# Patient Record
Sex: Female | Born: 1970 | Race: Black or African American | Hispanic: No | Marital: Single | Smoking: Never smoker
Health system: Southern US, Community
[De-identification: ages and names within clinical notes are randomized; demographics above are authoritative.]

## PROBLEM LIST (undated history)

## (undated) DIAGNOSIS — A64 Unspecified sexually transmitted disease: Secondary | ICD-10-CM

## (undated) DIAGNOSIS — G43009 Migraine without aura, not intractable, without status migrainosus: Secondary | ICD-10-CM

## (undated) DIAGNOSIS — H269 Unspecified cataract: Secondary | ICD-10-CM

## (undated) DIAGNOSIS — Z789 Other specified health status: Secondary | ICD-10-CM

## (undated) DIAGNOSIS — D259 Leiomyoma of uterus, unspecified: Secondary | ICD-10-CM

## (undated) DIAGNOSIS — M7580 Other shoulder lesions, unspecified shoulder: Secondary | ICD-10-CM

## (undated) HISTORY — DX: Migraine without aura, not intractable, without status migrainosus: G43.009

## (undated) HISTORY — PX: EYE SURGERY: SHX253

## (undated) HISTORY — DX: Unspecified sexually transmitted disease: A64

## (undated) HISTORY — DX: Leiomyoma of uterus, unspecified: D25.9

## (undated) HISTORY — PX: OTHER SURGICAL HISTORY: SHX169

## (undated) HISTORY — PX: FOOT SURGERY: SHX648

## (undated) HISTORY — DX: Other shoulder lesions, unspecified shoulder: M75.80

## (undated) HISTORY — DX: Unspecified cataract: H26.9

---

## 2006-01-30 HISTORY — PX: OTHER SURGICAL HISTORY: SHX169

## 2008-07-30 ENCOUNTER — Ambulatory Visit (HOSPITAL_COMMUNITY): Admission: RE | Admit: 2008-07-30 | Discharge: 2008-07-30 | Payer: Self-pay | Admitting: Obstetrics & Gynecology

## 2009-08-31 ENCOUNTER — Ambulatory Visit (HOSPITAL_COMMUNITY): Admission: RE | Admit: 2009-08-31 | Discharge: 2009-08-31 | Payer: Self-pay | Admitting: Obstetrics & Gynecology

## 2009-09-10 ENCOUNTER — Emergency Department (HOSPITAL_COMMUNITY): Admission: EM | Admit: 2009-09-10 | Discharge: 2009-09-10 | Payer: Self-pay | Admitting: Emergency Medicine

## 2010-04-15 LAB — RAPID STREP SCREEN (MED CTR MEBANE ONLY): Streptococcus, Group A Screen (Direct): NEGATIVE

## 2010-08-23 ENCOUNTER — Other Ambulatory Visit (HOSPITAL_COMMUNITY): Payer: Self-pay | Admitting: Occupational Therapy

## 2010-08-23 ENCOUNTER — Other Ambulatory Visit: Payer: Self-pay | Admitting: Nurse Practitioner

## 2010-08-23 DIAGNOSIS — Z1231 Encounter for screening mammogram for malignant neoplasm of breast: Secondary | ICD-10-CM

## 2010-09-02 ENCOUNTER — Ambulatory Visit (HOSPITAL_COMMUNITY)
Admission: RE | Admit: 2010-09-02 | Discharge: 2010-09-02 | Disposition: A | Discharge status: Home / Self Care | Payer: BC Managed Care – PPO | Source: Ambulatory Visit | Attending: Nurse Practitioner | Admitting: Nurse Practitioner

## 2010-09-02 DIAGNOSIS — Z1231 Encounter for screening mammogram for malignant neoplasm of breast: Secondary | ICD-10-CM

## 2011-01-31 HISTORY — PX: FOOT SURGERY: SHX648

## 2011-09-21 ENCOUNTER — Other Ambulatory Visit: Payer: Self-pay | Admitting: Nurse Practitioner

## 2011-09-21 DIAGNOSIS — Z1231 Encounter for screening mammogram for malignant neoplasm of breast: Secondary | ICD-10-CM

## 2011-10-17 ENCOUNTER — Ambulatory Visit (HOSPITAL_COMMUNITY)
Admission: RE | Admit: 2011-10-17 | Discharge: 2011-10-17 | Disposition: A | Discharge status: Home / Self Care | Payer: BC Managed Care – PPO | Source: Ambulatory Visit | Attending: Nurse Practitioner | Admitting: Nurse Practitioner

## 2011-10-17 DIAGNOSIS — Z1231 Encounter for screening mammogram for malignant neoplasm of breast: Secondary | ICD-10-CM | POA: Insufficient documentation

## 2012-01-07 ENCOUNTER — Encounter: Payer: Self-pay | Admitting: Gynecology

## 2012-01-07 NOTE — H&P (Signed)
[name] is an 41 y.o. female with known fibroid uterus who had menorrhagia controled with progestin only pills until recently.  Pt began having break thru bleeding for 1 month after missing 2 pills.  Pt had gonorrhea and chlamydia cultures done-both negative, and EMB and repeat u/s.  Ultrasound showed an increase in the size of fibroids largest 2 are 4x4cm and 3.5x3cm, other fibroids also noted.  Pt does not desire conception.  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    Past Medical History  Diagnosis Date  . Fibroid uterus   . Migraine headache without aura     No past surgical history on file.  No family history on file.  Social History:  does not have a smoking history on file. She does not have any smokeless tobacco history on file. Her alcohol and drug histories not on file.  Allergies: Allergies not on file  No prescriptions prior to admission    Review of Systems  Constitutional: Positive for malaise/fatigue. Negative for fever, chills, weight loss and diaphoresis.  HENT: Negative for hearing loss, ear pain, nosebleeds, congestion, sore throat, tinnitus and ear discharge.   Eyes: Negative.  Negative for blurred vision, double vision and photophobia.  Respiratory: Negative.  Negative for stridor.   Cardiovascular: Negative.   Gastrointestinal: Negative.   Genitourinary: Negative.   Musculoskeletal: Negative.   Skin: Negative for itching and rash.  Neurological: Positive for headaches. Negative for weakness.   Endo/Heme/Allergies: Negative.   Psychiatric/Behavioral: Negative.     There were no vitals taken for this visit. Physical Exam  Constitutional: Vital signs are normal. She appears well-developed and well-nourished.  HENT:  Head: Normocephalic and atraumatic.  Eyes: Conjunctivae normal and EOM are normal. Pupils are equal, round, and reactive to light.  Neck: Normal range of motion. Neck supple.  Cardiovascular: Normal rate, regular rhythm, normal heart sounds and intact distal pulses.   Respiratory: Effort normal and breath sounds normal.  GI: Soft. Normal appearance and bowel sounds are normal. There is no tenderness. Hernia confirmed negative in the right inguinal area and confirmed negative in the left inguinal area.  Genitourinary: Rectum normal. No breast swelling or tenderness. Pelvic exam was performed with patient supine. There is no rash, tenderness or lesion on the right labia. There is no rash, tenderness, lesion or injury on the left labia. Uterus is enlarged. Uterus is not fixed and not tender. Cervix exhibits no motion tenderness, no discharge and no friability. Right adnexum displays no mass, no tenderness and no fullness. Left adnexum displays no mass, no tenderness and no fullness. No tenderness around the vagina. No vaginal discharge found.  Musculoskeletal: Normal range of motion.  Lymphadenopathy:    She has no cervical adenopathy.    She has no axillary adenopathy.       Right: No inguinal adenopathy present.       Left: No inguinal adenopathy present.  Skin: Skin is warm, dry and intact.    No results found for this or any previous visit (from the past 24 hour(s)).  No results found.  Assessment/Plan: Symtomatic fibroid uterus with breakthough bleeding on progestin only oc's after being  stable for years, with increase in size of fibroids, now for definitive treatment for robotic hysterectomy with bilateral salpingectomies  Rilyn Upshaw H 01/07/2012, 8:06  PM

## 2012-01-08 ENCOUNTER — Encounter (HOSPITAL_COMMUNITY): Payer: Self-pay

## 2012-01-08 ENCOUNTER — Other Ambulatory Visit (HOSPITAL_COMMUNITY): Payer: BC Managed Care – PPO

## 2012-01-08 ENCOUNTER — Inpatient Hospital Stay (HOSPITAL_COMMUNITY): Admission: RE | Admit: 2012-01-08 | Discharge: 2012-01-08 | Payer: BC Managed Care – PPO | Source: Ambulatory Visit

## 2012-01-08 ENCOUNTER — Encounter (HOSPITAL_COMMUNITY): Payer: Self-pay | Admitting: Pharmacist

## 2012-01-08 HISTORY — DX: Other specified health status: Z78.9

## 2012-01-09 ENCOUNTER — Encounter (HOSPITAL_COMMUNITY): Payer: Self-pay | Admitting: Anesthesiology

## 2012-01-09 ENCOUNTER — Ambulatory Visit (HOSPITAL_COMMUNITY): Payer: BC Managed Care – PPO | Admitting: Anesthesiology

## 2012-01-09 ENCOUNTER — Encounter (HOSPITAL_COMMUNITY): Payer: Self-pay | Admitting: *Deleted

## 2012-01-09 ENCOUNTER — Encounter (HOSPITAL_COMMUNITY): Payer: Self-pay

## 2012-01-09 ENCOUNTER — Ambulatory Visit (HOSPITAL_COMMUNITY)
Admission: RE | Admit: 2012-01-09 | Discharge: 2012-01-10 | Disposition: A | Discharge status: Home / Self Care | Payer: BC Managed Care – PPO | Source: Ambulatory Visit | Attending: Gynecology | Admitting: Gynecology

## 2012-01-09 ENCOUNTER — Encounter (HOSPITAL_COMMUNITY)
Admission: RE | Disposition: A | Discharge status: Home / Self Care | Payer: Self-pay | Source: Ambulatory Visit | Attending: Gynecology

## 2012-01-09 DIAGNOSIS — N8000 Endometriosis of the uterus, unspecified: Secondary | ICD-10-CM | POA: Insufficient documentation

## 2012-01-09 DIAGNOSIS — D250 Submucous leiomyoma of uterus: Secondary | ICD-10-CM | POA: Insufficient documentation

## 2012-01-09 DIAGNOSIS — N920 Excessive and frequent menstruation with regular cycle: Secondary | ICD-10-CM | POA: Insufficient documentation

## 2012-01-09 DIAGNOSIS — D251 Intramural leiomyoma of uterus: Secondary | ICD-10-CM | POA: Insufficient documentation

## 2012-01-09 DIAGNOSIS — D252 Subserosal leiomyoma of uterus: Secondary | ICD-10-CM | POA: Insufficient documentation

## 2012-01-09 HISTORY — PX: CYSTOSCOPY: SHX5120

## 2012-01-09 HISTORY — PX: BILATERAL SALPINGECTOMY: SHX5743

## 2012-01-09 HISTORY — PX: ROBOTIC ASSISTED TOTAL HYSTERECTOMY: SHX6085

## 2012-01-09 LAB — SURGICAL PCR SCREEN
MRSA, PCR: NEGATIVE
Staphylococcus aureus: NEGATIVE

## 2012-01-09 LAB — URINALYSIS, ROUTINE W REFLEX MICROSCOPIC
Glucose, UA: NEGATIVE mg/dL
Ketones, ur: 15 mg/dL — AB
Leukocytes, UA: NEGATIVE
Nitrite: NEGATIVE
Specific Gravity, Urine: 1.015 (ref 1.005–1.030)
pH: 7 (ref 5.0–8.0)

## 2012-01-09 LAB — ABO/RH: ABO/RH(D): O POS

## 2012-01-09 LAB — PREGNANCY, URINE: Preg Test, Ur: NEGATIVE

## 2012-01-09 LAB — URINE MICROSCOPIC-ADD ON

## 2012-01-09 LAB — TYPE AND SCREEN: ABO/RH(D): O POS

## 2012-01-09 LAB — CBC
Hemoglobin: 13.7 g/dL (ref 12.0–15.0)
Platelets: 244 10*3/uL (ref 150–400)
RBC: 4.56 MIL/uL (ref 3.87–5.11)
WBC: 5.7 10*3/uL (ref 4.0–10.5)

## 2012-01-09 SURGERY — ROBOTIC ASSISTED TOTAL HYSTERECTOMY
Anesthesia: General | Site: Abdomen | Wound class: Clean Contaminated

## 2012-01-09 MED ORDER — LIDOCAINE HCL (CARDIAC) 20 MG/ML IV SOLN
INTRAVENOUS | Status: AC
  Filled 2012-01-09: qty 5

## 2012-01-09 MED ORDER — ACETAMINOPHEN 10 MG/ML IV SOLN
INTRAVENOUS | Status: AC
  Administered 2012-01-09: 1000 mg via INTRAVENOUS
  Filled 2012-01-09: qty 100

## 2012-01-09 MED ORDER — HYDROCODONE-ACETAMINOPHEN 5-325 MG PO TABS
1.0000 | ORAL_TABLET | ORAL | Status: DC | PRN
  Administered 2012-01-09 – 2012-01-10 (×3): 2 via ORAL
  Filled 2012-01-09 (×3): qty 2

## 2012-01-09 MED ORDER — GLYCOPYRROLATE 0.2 MG/ML IJ SOLN: INTRAMUSCULAR | Status: DC | PRN

## 2012-01-09 MED ORDER — FENTANYL CITRATE 0.05 MG/ML IJ SOLN
INTRAMUSCULAR | Status: AC
  Filled 2012-01-09: qty 5

## 2012-01-09 MED ORDER — ONDANSETRON HCL 4 MG/2ML IJ SOLN
INTRAMUSCULAR | Status: AC
  Filled 2012-01-09: qty 2

## 2012-01-09 MED ORDER — SODIUM CHLORIDE 0.9 % IJ SOLN
INTRAMUSCULAR | Status: DC | PRN
  Administered 2012-01-09: 60 mL

## 2012-01-09 MED ORDER — LIDOCAINE HCL (CARDIAC) 20 MG/ML IV SOLN
INTRAVENOUS | Status: DC | PRN
  Administered 2012-01-09: 50 mg via INTRAVENOUS

## 2012-01-09 MED ORDER — ROPIVACAINE HCL 5 MG/ML IJ SOLN
INTRAMUSCULAR | Status: AC
  Filled 2012-01-09: qty 60

## 2012-01-09 MED ORDER — DEXAMETHASONE SODIUM PHOSPHATE 10 MG/ML IJ SOLN
INTRAMUSCULAR | Status: DC | PRN
  Administered 2012-01-09: 10 mg via INTRAVENOUS

## 2012-01-09 MED ORDER — NEOSTIGMINE METHYLSULFATE 1 MG/ML IJ SOLN
INTRAMUSCULAR | Status: DC | PRN
  Administered 2012-01-09: 3 mg via INTRAVENOUS

## 2012-01-09 MED ORDER — STERILE WATER FOR IRRIGATION IR SOLN
Status: DC | PRN
  Administered 2012-01-09: 2000 mL via INTRAVESICAL

## 2012-01-09 MED ORDER — GLYCOPYRROLATE 0.2 MG/ML IJ SOLN
INTRAMUSCULAR | Status: DC | PRN
  Administered 2012-01-09: 0.6 mg via INTRAVENOUS

## 2012-01-09 MED ORDER — ROPIVACAINE HCL 5 MG/ML IJ SOLN
INTRAMUSCULAR | Status: DC | PRN
  Administered 2012-01-09: 60 mL

## 2012-01-09 MED ORDER — ARTIFICIAL TEARS OP OINT
TOPICAL_OINTMENT | OPHTHALMIC | Status: DC | PRN
  Administered 2012-01-09: 1 via OPHTHALMIC

## 2012-01-09 MED ORDER — FENTANYL CITRATE 0.05 MG/ML IJ SOLN: 25.0000 ug | INTRAMUSCULAR | Status: DC | PRN

## 2012-01-09 MED ORDER — LACTATED RINGERS IV SOLN
INTRAVENOUS | Status: DC
  Administered 2012-01-09: 125 mL/h via INTRAVENOUS
  Administered 2012-01-09: 14:00:00 via INTRAVENOUS
  Administered 2012-01-09: 125 mL/h via INTRAVENOUS

## 2012-01-09 MED ORDER — ARTIFICIAL TEARS OP OINT
TOPICAL_OINTMENT | OPHTHALMIC | Status: AC
  Filled 2012-01-09: qty 3.5

## 2012-01-09 MED ORDER — CEFAZOLIN SODIUM-DEXTROSE 2-3 GM-% IV SOLR
INTRAVENOUS | Status: AC
  Filled 2012-01-09: qty 50

## 2012-01-09 MED ORDER — ROCURONIUM BROMIDE 100 MG/10ML IV SOLN
INTRAVENOUS | Status: DC | PRN
  Administered 2012-01-09: 10 mg via INTRAVENOUS
  Administered 2012-01-09: 20 mg via INTRAVENOUS
  Administered 2012-01-09: 60 mg via INTRAVENOUS

## 2012-01-09 MED ORDER — DEXAMETHASONE SODIUM PHOSPHATE 10 MG/ML IJ SOLN
INTRAMUSCULAR | Status: AC
  Filled 2012-01-09: qty 1

## 2012-01-09 MED ORDER — PROPOFOL 10 MG/ML IV EMUL
INTRAVENOUS | Status: AC
  Filled 2012-01-09: qty 20

## 2012-01-09 MED ORDER — GLYCOPYRROLATE 0.2 MG/ML IJ SOLN
INTRAMUSCULAR | Status: AC
  Filled 2012-01-09: qty 3

## 2012-01-09 MED ORDER — CEFAZOLIN SODIUM-DEXTROSE 2-3 GM-% IV SOLR
2.0000 g | INTRAVENOUS | Status: AC
  Administered 2012-01-09: 2 g via INTRAVENOUS

## 2012-01-09 MED ORDER — MUPIROCIN 2 % EX OINT
TOPICAL_OINTMENT | Freq: Once | CUTANEOUS | Status: AC
  Administered 2012-01-09: 1 via NASAL

## 2012-01-09 MED ORDER — MUPIROCIN 2 % EX OINT
TOPICAL_OINTMENT | CUTANEOUS | Status: AC
  Administered 2012-01-09: 1 via NASAL
  Filled 2012-01-09: qty 22

## 2012-01-09 MED ORDER — LACTATED RINGERS IR SOLN
Status: DC | PRN
  Administered 2012-01-09: 3000 mL

## 2012-01-09 MED ORDER — KETOROLAC TROMETHAMINE 30 MG/ML IJ SOLN
INTRAMUSCULAR | Status: AC
  Filled 2012-01-09: qty 1

## 2012-01-09 MED ORDER — ONDANSETRON HCL 4 MG/2ML IJ SOLN
INTRAMUSCULAR | Status: DC | PRN
  Administered 2012-01-09: 4 mg via INTRAVENOUS

## 2012-01-09 MED ORDER — DEXTROSE IN LACTATED RINGERS 5 % IV SOLN: INTRAVENOUS | Status: DC

## 2012-01-09 MED ORDER — MIDAZOLAM HCL 5 MG/5ML IJ SOLN
INTRAMUSCULAR | Status: DC | PRN
  Administered 2012-01-09: 2 mg via INTRAVENOUS

## 2012-01-09 MED ORDER — HYDROMORPHONE HCL PF 1 MG/ML IJ SOLN
INTRAMUSCULAR | Status: AC
  Filled 2012-01-09: qty 1

## 2012-01-09 MED ORDER — HYDROMORPHONE HCL PF 1 MG/ML IJ SOLN
0.2000 mg | INTRAMUSCULAR | Status: DC | PRN
  Administered 2012-01-09 (×2): 0.5 mg via INTRAVENOUS

## 2012-01-09 MED ORDER — INDIGOTINDISULFONATE SODIUM 8 MG/ML IJ SOLN
INTRAMUSCULAR | Status: AC
  Filled 2012-01-09: qty 5

## 2012-01-09 MED ORDER — PROPOFOL 10 MG/ML IV BOLUS
INTRAVENOUS | Status: DC | PRN
  Administered 2012-01-09: 200 mg via INTRAVENOUS

## 2012-01-09 MED ORDER — ROCURONIUM BROMIDE 50 MG/5ML IV SOLN
INTRAVENOUS | Status: AC
  Filled 2012-01-09: qty 1

## 2012-01-09 MED ORDER — LACTATED RINGERS IV SOLN
INTRAVENOUS | Status: DC
  Administered 2012-01-09 – 2012-01-10 (×2): via INTRAVENOUS

## 2012-01-09 MED ORDER — MIDAZOLAM HCL 2 MG/2ML IJ SOLN
INTRAMUSCULAR | Status: AC
  Filled 2012-01-09: qty 2

## 2012-01-09 MED ORDER — INDIGOTINDISULFONATE SODIUM 8 MG/ML IJ SOLN
INTRAMUSCULAR | Status: DC | PRN
  Administered 2012-01-09: 5 mL via INTRAVENOUS

## 2012-01-09 MED ORDER — FENTANYL CITRATE 0.05 MG/ML IJ SOLN
INTRAMUSCULAR | Status: DC | PRN
  Administered 2012-01-09: 150 ug via INTRAVENOUS
  Administered 2012-01-09 (×2): 50 ug via INTRAVENOUS

## 2012-01-09 MED ORDER — KETOROLAC TROMETHAMINE 30 MG/ML IJ SOLN
15.0000 mg | Freq: Once | INTRAMUSCULAR | Status: AC | PRN
  Administered 2012-01-09: 30 mg via INTRAVENOUS

## 2012-01-09 SURGICAL SUPPLY — 76 items
BAG URINE DRAINAGE (UROLOGICAL SUPPLIES) ×4 IMPLANT
BARRIER ADHS 3X4 INTERCEED (GAUZE/BANDAGES/DRESSINGS) ×4 IMPLANT
BENZOIN TINCTURE PRP APPL 2/3 (GAUZE/BANDAGES/DRESSINGS) IMPLANT
CABLE HIGH FREQUENCY MONO STRZ (ELECTRODE) ×4 IMPLANT
CHLORAPREP W/TINT 26ML (MISCELLANEOUS) ×4 IMPLANT
CONT PATH 16OZ SNAP LID 3702 (MISCELLANEOUS) ×4 IMPLANT
COVER MAYO STAND STRL (DRAPES) ×4 IMPLANT
COVER TABLE BACK 60X90 (DRAPES) ×8 IMPLANT
COVER TIP SHEARS 8 DVNC (MISCELLANEOUS) ×3 IMPLANT
COVER TIP SHEARS 8MM DA VINCI (MISCELLANEOUS) ×1
DECANTER SPIKE VIAL GLASS SM (MISCELLANEOUS) ×4 IMPLANT
DERMABOND ADVANCED (GAUZE/BANDAGES/DRESSINGS) ×1
DERMABOND ADVANCED .7 DNX12 (GAUZE/BANDAGES/DRESSINGS) ×3 IMPLANT
DRAPE HUG U DISPOSABLE (DRAPE) ×4 IMPLANT
DRAPE LG THREE QUARTER DISP (DRAPES) ×8 IMPLANT
DRAPE WARM FLUID 44X44 (DRAPE) ×4 IMPLANT
ELECT REM PT RETURN 9FT ADLT (ELECTROSURGICAL) ×4
ELECTRODE REM PT RTRN 9FT ADLT (ELECTROSURGICAL) ×3 IMPLANT
EVACUATOR SMOKE 8.L (FILTER) ×4 IMPLANT
GAUZE VASELINE 3X9 (GAUZE/BANDAGES/DRESSINGS) IMPLANT
GLOVE BIO SURGEON STRL SZ 6.5 (GLOVE) ×16 IMPLANT
GLOVE BIOGEL M 6.5 STRL (GLOVE) ×20 IMPLANT
GLOVE BIOGEL PI IND STRL 6.5 (GLOVE) ×9 IMPLANT
GLOVE BIOGEL PI IND STRL 7.0 (GLOVE) ×27 IMPLANT
GLOVE BIOGEL PI INDICATOR 6.5 (GLOVE) ×3
GLOVE BIOGEL PI INDICATOR 7.0 (GLOVE) ×9
GLOVE SURG SS PI 7.0 STRL IVOR (GLOVE) ×20 IMPLANT
GOWN STRL REIN XL XLG (GOWN DISPOSABLE) ×32 IMPLANT
GYRUS RUMI II 2.5CM BLUE (DISPOSABLE)
GYRUS RUMI II 3.5CM BLUE (DISPOSABLE)
GYRUS RUMI II 4.0CM BLUE (DISPOSABLE)
KIT ACCESSORY DA VINCI DISP (KITS) ×1
KIT ACCESSORY DVNC DISP (KITS) ×3 IMPLANT
LEGGING LITHOTOMY PAIR STRL (DRAPES) ×4 IMPLANT
NEEDLE INSUFFLATION 14GA 120MM (NEEDLE) ×4 IMPLANT
OCCLUDER COLPOPNEUMO (BALLOONS) ×4 IMPLANT
PACK LAVH (CUSTOM PROCEDURE TRAY) ×4 IMPLANT
PAD PREP 24X48 CUFFED NSTRL (MISCELLANEOUS) ×8 IMPLANT
PLUG CATH AND CAP STER (CATHETERS) ×4 IMPLANT
PROTECTOR NERVE ULNAR (MISCELLANEOUS) ×12 IMPLANT
RUMI II 3.0CM BLUE KOH-EFFICIE (DISPOSABLE) IMPLANT
RUMI II GYRUS 2.5CM BLUE (DISPOSABLE) IMPLANT
RUMI II GYRUS 3.5CM BLUE (DISPOSABLE) IMPLANT
RUMI II GYRUS 4.0CM BLUE (DISPOSABLE) IMPLANT
SET CYSTO W/LG BORE CLAMP LF (SET/KITS/TRAYS/PACK) ×4 IMPLANT
SET IRRIG TUBING LAPAROSCOPIC (IRRIGATION / IRRIGATOR) ×4 IMPLANT
SOLUTION ELECTROLUBE (MISCELLANEOUS) ×4 IMPLANT
STRIP CLOSURE SKIN 1/4X4 (GAUZE/BANDAGES/DRESSINGS) IMPLANT
SUT VIC AB 0 CT1 27 (SUTURE) ×3
SUT VIC AB 0 CT1 27XBRD ANBCTR (SUTURE) ×6 IMPLANT
SUT VIC AB 0 CT1 27XBRD ANTBC (SUTURE) ×3 IMPLANT
SUT VICRYL 0 UR6 27IN ABS (SUTURE) ×4 IMPLANT
SUT VICRYL RAPIDE 4/0 PS 2 (SUTURE) ×8 IMPLANT
SUT VLOC 180 0 6IN GS21 (SUTURE) ×4 IMPLANT
SUT VLOC 180 0 9IN  GS21 (SUTURE) ×1
SUT VLOC 180 0 9IN GS21 (SUTURE) ×3 IMPLANT
SYR 30ML LL (SYRINGE) ×4 IMPLANT
SYR 50ML LL SCALE MARK (SYRINGE) ×4 IMPLANT
SYRINGE 10CC LL (SYRINGE) ×4 IMPLANT
SYSTEM CONVERTIBLE TROCAR (TROCAR) ×4 IMPLANT
TIP RUMI ORANGE 6.7MMX12CM (TIP) IMPLANT
TIP UTERINE 5.1X6CM LAV DISP (MISCELLANEOUS) IMPLANT
TIP UTERINE 6.7X10CM GRN DISP (MISCELLANEOUS) IMPLANT
TIP UTERINE 6.7X6CM WHT DISP (MISCELLANEOUS) IMPLANT
TIP UTERINE 6.7X8CM BLUE DISP (MISCELLANEOUS) ×4 IMPLANT
TOWEL OR 17X24 6PK STRL BLUE (TOWEL DISPOSABLE) ×12 IMPLANT
TRAY FOLEY BAG SILVER LF 14FR (CATHETERS) ×4 IMPLANT
TROCAR 12M 150ML BLUNT (TROCAR) ×4 IMPLANT
TROCAR DISP BLADELESS 8 DVNC (TROCAR) ×3 IMPLANT
TROCAR DISP BLADELESS 8MM (TROCAR) ×1
TROCAR XCEL 12X100 BLDLESS (ENDOMECHANICALS) ×4 IMPLANT
TROCAR XCEL NON-BLD 5MMX100MML (ENDOMECHANICALS) ×4 IMPLANT
TROCAR Z THRD FIOS 12X150 (TROCAR) ×4 IMPLANT
TUBING FILTER THERMOFLATOR (ELECTROSURGICAL) ×4 IMPLANT
WARMER LAPAROSCOPE (MISCELLANEOUS) ×4 IMPLANT
WATER STERILE IRR 1000ML POUR (IV SOLUTION) ×12 IMPLANT

## 2012-01-09 NOTE — Preoperative (Signed)
Beta Blockers   Reason not to administer Beta Blockers:Not Applicable 

## 2012-01-09 NOTE — Anesthesia Postprocedure Evaluation (Signed)
Anesthesia Post Note  Patient: [name]  Procedure(s) Performed: Procedure(s) (LRB): ROBOTIC ASSISTED TOTAL HYSTERECTOMY (N/A) BILATERAL SALPINGECTOMY (Bilateral) CYSTOSCOPY (N/A)  Anesthesia type: GA  Patient location: PACU  Post pain: Pain level controlled  Post assessment: Post-op Vital signs reviewed  Last Vitals:  Filed Vitals:   01/09/12 1600  BP: 123/64  Pulse: 68  Temp:   Resp: 16    Post vital signs: Reviewed  Level of consciousness: sedated  Complications: No apparent anesthesia complications

## 2012-01-09 NOTE — Interval H&P Note (Signed)
History and Physical Interval Note:  01/09/2012 11:46 AM  [name]  has presented today for surgery, with the diagnosis of Fibroids  The various methods of treatment have been discussed with the patient and family. After consideration of risks, benefits and other options for treatment, the patient has consented to  Procedure(s) (LRB) with comments: ROBOTIC ASSISTED TOTAL HYSTERECTOMY (N/A) as a surgical intervention .  The patient's history has been reviewed, patient examined, no change in status, stable for surgery.  I have reviewed the patient's chart and labs.  Questions were answered to the patient's satisfaction.     [name] H  No change-tl

## 2012-01-09 NOTE — Brief Op Note (Signed)
01/09/2012  3:26 PM  PATIENT:  [name]  41 y.o. female  PRE-OPERATIVE DIAGNOSIS:  Fibroids  POST-OPERATIVE DIAGNOSIS:  Fibroids  PROCEDURE:  Procedure(s) (LRB) with comments: ROBOTIC ASSISTED TOTAL HYSTERECTOMY (N/A) BILATERAL SALPINGECTOMY (Bilateral) CYSTOSCOPY (N/A)  SURGEON:  Surgeon(s) and Role:    * Bennye Alm, MD - Primary    * Alison Murray, MD - Assisting  PHYSICIAN ASSISTANT:   ASSISTANTS: none   ANESTHESIA:   general   EBL:  Total I/O In: 1000 [I.V.:1000] Out: 300 [Urine:200; Blood:100]  BLOOD ADMINISTERED:none  DRAINS: none   LOCAL MEDICATIONS USED:  OTHER 10cc ropivicaine  SPECIMEN:  Source of Specimen:  Uterus, cervix, bilateral tubes  DISPOSITION OF SPECIMEN:  PATHOLOGY  COUNTS:  YES  TOURNIQUET:  * No tourniquets in log *  DICTATION: .Other Dictation: Dictation Number [id number]  PLAN OF CARE: outpatient in bed  PATIENT DISPOSITION:  Short Stay   Delay start of Pharmacological VTE agent (>24hrs) due to surgical blood loss or risk of bleeding: no

## 2012-01-09 NOTE — Anesthesia Preprocedure Evaluation (Signed)
Anesthesia Evaluation  Patient identified by MRN, date of birth, ID band Patient awake    Reviewed: Allergy & Precautions, H&P , NPO status , Patient's Chart, lab work & pertinent test results, reviewed documented beta blocker date and time   History of Anesthesia Complications Negative for: history of anesthetic complications  Airway Mallampati: I TM Distance: >3 FB Neck ROM: full    Dental  (+) Teeth Intact   Pulmonary neg pulmonary ROS,  breath sounds clear to auscultation  Pulmonary exam normal       Cardiovascular Exercise Tolerance: Good negative cardio ROS  Rhythm:regular Rate:Normal     Neuro/Psych  Headaches, negative psych ROS   GI/Hepatic negative GI ROS, Neg liver ROS,   Endo/Other  negative endocrine ROS  Renal/GU negative Renal ROS  Female GU complaint     Musculoskeletal   Abdominal   Peds  Hematology negative hematology ROS (+)   Anesthesia Other Findings   Reproductive/Obstetrics negative OB ROS                           Anesthesia Physical Anesthesia Plan  ASA: II  Anesthesia Plan: General ETT   Post-op Pain Management:    Induction:   Airway Management Planned:   Additional Equipment:   Intra-op Plan:   Post-operative Plan:   Informed Consent: I have reviewed the patients History and Physical, chart, labs and discussed the procedure including the risks, benefits and alternatives for the proposed anesthesia with the patient or authorized representative who has indicated his/her understanding and acceptance.   Dental Advisory Given  Plan Discussed with: CRNA and Surgeon  Anesthesia Plan Comments:         Anesthesia Quick Evaluation

## 2012-01-09 NOTE — Transfer of Care (Signed)
Immediate Anesthesia Transfer of Care Note  Patient: [name]  Procedure(s) Performed: Procedure(s) (LRB) with comments: ROBOTIC ASSISTED TOTAL HYSTERECTOMY (N/A) BILATERAL SALPINGECTOMY (Bilateral) CYSTOSCOPY (N/A)  Patient Location: PACU  Anesthesia Type:General  Level of Consciousness: awake, alert  and oriented  Airway & Oxygen Therapy: Patient Spontanous Breathing, Oxygen via Nasal cannula  Post-op Assessment: Report given to PACU RN, Post -op Vital signs reviewed and stable and Patient moving all extremities X 4  Post vital signs: Reviewed and stable  Complications: No apparent anesthesia complications

## 2012-01-10 NOTE — Anesthesia Postprocedure Evaluation (Signed)
Anesthesia Post Note  Patient: [name]  Procedure(s) Performed: Procedure(s) (LRB): ROBOTIC ASSISTED TOTAL HYSTERECTOMY (N/A) BILATERAL SALPINGECTOMY (Bilateral) CYSTOSCOPY (N/A)  Anesthesia type: General  Patient location: Mother/Baby  Post pain: Pain level controlled  Post assessment: Post-op Vital signs reviewed  Last Vitals:  Filed Vitals:   01/10/12 0525  BP: 111/44  Pulse: 67  Temp: 36.7 C  Resp: 18    Post vital signs: Reviewed  Level of consciousness: awake and alert   Complications: No apparent anesthesia complications

## 2012-01-10 NOTE — Op Note (Signed)
[name], [name]              ACCOUNT NO.:  [id number]  MEDICAL RECORD NO.:  [id number]  LOCATION:  9303                          FACILITY:  [facility]  PHYSICIAN:  Ivor Costa. Farrel Gobble, M.D. DATE OF BIRTH:  09/14/1970  DATE OF PROCEDURE:  01/09/2012 DATE OF DISCHARGE:                              OPERATIVE REPORT   PREOPERATIVE DIAGNOSIS:  Symptomatic fibroid uterus.  POSTOPERATIVE DIAGNOSIS:  Symptomatic fibroid uterus.  PROCEDURE:  Robotic total hysterotomy with bilateral salpingo- oophorectomy.  SURGEON:  Ivor Costa. Farrel Gobble, M.D.  ASSISTANT:  Edwena Felty. Romine, M.D.  ANESTHESIA:  General.  IV FLUIDS:  1700 mL of lactated ringers.  ESTIMATED BLOOD LOSS:  100 mL.  URINE OUTPUT:  200 mL of clear urine.  FINDINGS:  A markedly enlarged and distorted uterus with multiple fibroids.  The tubes and ovaries were unremarkable.  The upper abdomen was unremarkable.    Pathology:  uterus, cervix, and tubes bilaterally.  COMPLICATIONS:  None.  PROCEDURE:  The patient was taken to the operating room, placed in the dorsal lithotomy position while awake to confirm comfort in the stirrups.  General endotracheal sedation was induced.  The patient's abdomen and vagina were then prepped and draped in usual sterile fashion.  Bimanual exam was performed and the mobility of the uterus was confirmed.  A sterile weighted speculum was then placed in the posterior cul-de-sac.  The bladder was deviated with a Breisky-Navratil retractor. The cervix was visualized, grasped with a single-tooth tenaculum.  The cervix was gently dilated up to a total of 21 [place].  The uterus sounded to 8 and a stitch was placed anteriorly, which was later fed through a 2.5 cm KOH ring, and a #8 RUMI manipulator was then advanced into the cervix.  A Foley was then placed.  The uterus was deviated anterior and cephalad and the most superior aspect was appreciated and the placement of the camera port was then determined after  abdominal exam.  Gloves were then changed.  The skin, subcu, and fascia were then injected with bupivacaine, and an incision was made approximately one fingerbreadth above the umbilicus.  A Veress needle was inserted, opening pressure was noted to be 2.  A pneumoperitoneum was then created until tympany was appreciated above the liver, after which the Veress was removed and the #12 trocar was advanced through and into the pelvis, placement was confirmed.  The pelvis was inspected and is mentioned as above. 60cc marcaine was placed in the pelvis through the port.  We then determined the best placement for the lateral ports and the assistance ports.  They were approximately 9 cm lateral to the camera port which was in the midline and the assistance port was made in the right lower quadrant.  All ports were placed under direct visualization. The robot was then docked parallel to the bed after the pt was placed in trendelenburg position, and the ports were attached.  The instruments were then placed through the lateral ports under direct visualization and brought to the midline over the uterus.  The bowel was swept away.  Both adnexa were inspected, noted to be unremarkable.  Both ureters were able to be visualized with peristalsis evident  bilaterally.  The right tube was then elevated.  The IP was identified and the tube was sharply dissected off after cautery with the PK to the level of the uterine fundus.  The tuboovarian ligament was then cauterized and transected sharply and both were noted to be hemostatic, this was done bilaterally.  The round ligament was elevated, cauterized, and sharply transected.  All pedicles were noted to be hemostatic.  The anterior leaf of the broad ligament was then identified, tented up, and entered sharply with the scissors.  Areas of bleeding were treated with cautery.  The anterior bladder flap was then created swinging most of the way to the patient's left  side.  The KOH ring was identified.  The anterior peritoneum was tented up and the sharp dissection of the bladder off the cervix and anterior uterus was performed and white endopelvic fascia was able to be visualized.  Attention was then turned to the right angle. The uterine vessels were skeletonized.  The posterior peritoneum was incised sharply.  After identification of the vessels, they were treated with cautery, however, were not transected until the procedure was completed on the patient's left. The uterus was deviated towards the Left, and the procedure was repeated. The anterior leaf of the broad ligament was then incised to meet with the prior dissection.  The patient had approximately 3 cm lateral fibroid on the left hand side.  This was gently pushed out of the way for better visualization of the uterine vessels.  The posterior leaf of the broad ligament was incised and skeletonization of the vessels ensued.  Once identified, the uterine artery and vein were cauterized with the PK and then ultimately transected sharply.  The pedicles were noted to be hemostatic.  The attention was then turned towards the right side and the uterine vessels again were cauterized and sharply dissected. Identification of the KOH ring both anteriorly and posteriorly was confirmed.  The vessels were noted to be cauterized free of the KOH ring.  Prior to the anterior colpotomy, because of the multiple fibroids, the uterus was elevated so the posterior fibroid could be partially morcellated to aid in passage through the vagina. An anterior colpotomy was then performed using coag until the KOH ring was visualized and under direct visualization, the incision was extended laterally to both the left and right side,with good visualization of the vessels as well as the bladder anteriorly.  Once an entire colpotomy was performed, the specimen was able to be passed through the cervix and into the vagina.  The  ring was inflated in order to maintain a pneumoperitoneum.  The instruments were then changed and a needle driver and Cobra were then advanced into the pelvis. Interlocking suture was then placed into the camera port and brought into the pelvis.  The cuff was plicated including the vagina and posterior peritoneum starting at the left angle and marching towards the right.  The suture was incidentally cut at the right angle.  The second suture was then advanced through the camera port and using the primary suture for leverage, the right angle was secured and the cuff was reapproximated again approximately one-third of the way towards the left.  Inspection of the cuff was felt to be hemostatic.  The sutures were cut and  the needles were placed in the sidewall for later removal.  The pelvis was then irrigated with copious amount of saline. The cuff was felt to be hemostatic and the fluid was removed.  A piece  of Interceed was then advanced into the pelvis and gently laid over the operative cuff as well as the anterior peritoneum. Peristalsis of both ureters was confirmed.  The instruments were then removed.  Both needles were then brought through the camera port. The trocars were then removed under direct visualization.  The camera port was used to help remove excess gas in the abdomen and was then removed.  The S retractors were then advanced through the umbilical port.  The fascia and peritoneum were elevated.  The fascia was then identified.  The fascia was plicated anterior-posterior with 0 Vicryl and the fascial defect was felt to be intact.  The skin and all 4 ports were then closed with 4-0 Vicryl and Dermabond was then placed afterwards.  Attention was then turned back to the vagina.  The patient had been given an amp of indigo carmine.  The cystoscope was then performed.  Both ureteral ostia were able to be visualized with streams of indigo carmine.  The anterior dome of the bladder  was identified and felt to be intact as was the remainder of the bladder.  The bivalve speculum was then placed in the vagina.  The vagina was cleared of any residual blood, and the cuff looked to be intact.  The patient tolerated the procedure well.  Sponges, lap and needle counts were correct x2.  She was given 2 g of Ancef intraoperatively.  She was given 1 g of IV Tylenol preoperatively.  She had received 400 mg of Celebrex preoperatively as well.     Ivor Costa. Farrel Gobble, M.D.     THL/MEDQ  D:  01/09/2012  T:  01/10/2012  Job:  [id number]

## 2012-01-10 NOTE — Progress Notes (Signed)
Pt   Ambulated  Out   Teaching complete  

## 2012-01-10 NOTE — Discharge Summary (Signed)
  S:  Pt s/p robotic total hysterectomy with bilateral salpingectomy, kept overnight due to timing of surgery.  Pt w/o complaints, voiding freely, tolerating po, ambulating without difficulty. Dilaudid working well for pain. Pt eager for d/c home. O: AVSS  Gen: NAD  Heart:S1S2  Lungs: CTA   Abd: +BS, soft, NT incsions C/D/I  Ext: NT A/P:  Overnight observation ready for d/c this am, has rx for post op pain rx at home.  F/U appt set 01/16/12 2:45

## 2012-01-10 NOTE — Addendum Note (Signed)
Addendum  created 01/10/12 0953 by Jhonnie Garner, CRNA   Modules edited:Notes Section

## 2012-01-12 ENCOUNTER — Encounter (HOSPITAL_COMMUNITY): Payer: Self-pay | Admitting: Gynecology

## 2012-04-26 ENCOUNTER — Telehealth: Payer: Self-pay | Admitting: Nurse Practitioner

## 2012-04-26 NOTE — Telephone Encounter (Signed)
Patient reports she has bacterial or yeast infection from being on PCN following oral surgery.  Office visit 04-29-12

## 2012-04-26 NOTE — Telephone Encounter (Signed)
bacterial inf?/pt wants to schedule an appt with pg/[place]

## 2012-04-29 ENCOUNTER — Encounter: Payer: Self-pay | Admitting: Nurse Practitioner

## 2012-04-29 ENCOUNTER — Ambulatory Visit (INDEPENDENT_AMBULATORY_CARE_PROVIDER_SITE_OTHER): Payer: BC Managed Care – PPO | Admitting: Nurse Practitioner

## 2012-04-29 VITALS — BP 110/70 | HR 72 | Resp 18 | Wt 237.0 lb

## 2012-04-29 DIAGNOSIS — B9689 Other specified bacterial agents as the cause of diseases classified elsewhere: Secondary | ICD-10-CM

## 2012-04-29 DIAGNOSIS — A499 Bacterial infection, unspecified: Secondary | ICD-10-CM

## 2012-04-29 DIAGNOSIS — K625 Hemorrhage of anus and rectum: Secondary | ICD-10-CM

## 2012-04-29 DIAGNOSIS — N760 Acute vaginitis: Secondary | ICD-10-CM

## 2012-04-29 DIAGNOSIS — N898 Other specified noninflammatory disorders of vagina: Secondary | ICD-10-CM

## 2012-04-29 DIAGNOSIS — N899 Noninflammatory disorder of vagina, unspecified: Secondary | ICD-10-CM

## 2012-04-29 LAB — POCT URINALYSIS DIPSTICK
Bilirubin, UA: NEGATIVE
Blood, UA: NEGATIVE
Leukocytes, UA: NEGATIVE
Nitrite, UA: NEGATIVE
Protein, UA: NEGATIVE

## 2012-04-29 MED ORDER — METRONIDAZOLE 0.75 % VA GEL: 1.0000 | Freq: Two times a day (BID) | VAGINAL | Status: DC

## 2012-04-29 NOTE — Progress Notes (Deleted)
42 y.o. Single{Race/ethnicity:17218} female   G1P0010 here for annual exam.    Patient's last menstrual period was 09/26/2011.          Sexually active: {yes no:314532}  The current method of family planning is {contraception:315051}.    Exercising: {yes no:314532}  {types:19826} Last mammogram: Last pap: Last BMD:  Alcohol: Tobacco:   No health maintenance topics applied.  No family history on file.  There is no problem list on file for this patient.   Past Medical History  Diagnosis Date  . Fibroid uterus   . Migraine headache without aura   . No pertinent past medical history     Past Surgical History  Procedure Laterality Date  . Foot surgery  2013  . Robotic assisted total hysterectomy  01/09/2012    Procedure: ROBOTIC ASSISTED TOTAL HYSTERECTOMY;  Surgeon: Bennye Alm, MD;  Location: [facility];  Service: Gynecology;  Laterality: N/A;  . Bilateral salpingectomy  01/09/2012    Procedure: BILATERAL SALPINGECTOMY;  Surgeon: Bennye Alm, MD;  Location: [facility];  Service: Gynecology;  Laterality: Bilateral;  . Cystoscopy  01/09/2012    Procedure: CYSTOSCOPY;  Surgeon: Bennye Alm, MD;  Location: [facility];  Service: Gynecology;  Laterality: N/A;    Allergies: Latex  Current Outpatient Prescriptions  Medication Sig Dispense Refill  . cyanocobalamin 100 MCG tablet Take 100 mcg by mouth daily.      . fish oil-omega-3 fatty acids 1000 MG capsule Take 2 g by mouth daily.      . Multiple Vitamin (MULTIVITAMIN WITH MINERALS) TABS Take 1 tablet by mouth daily.       No current facility-administered medications for this visit.    ROS: {Ros - complete:30496}  Exam:    BP 110/70  Pulse 72  Resp 18  Wt 237 lb (107.502 kg)  BMI 37.11 kg/m2  LMP 09/26/2011 Weight change: @WEIGHTCHANGE @ Last 3 height recordings:  Ht Readings from Last 3 Encounters:  01/09/12 5\' 7"  (1.702 m)  01/09/12 5\' 7"  (1.702 m)   General appearance: {general exam:16600} Head: {head  exam:30909::"Normocephalic, without obvious abnormality","atraumatic"} Neck: {neck exam:17463::"no adenopathy","no carotid bruit","no JVD","supple, symmetrical, trachea midline","thyroid not enlarged, symmetric, no tenderness/mass/nodules"} Lungs: {lung exam:16931} Breasts: {breast exam:13139::"normal appearance, no masses or tenderness"} Heart: {heart exam:5510} Abdomen: {abdominal exam:16834} Extremities: {extremity exam:5109} Skin: {skin exam:31329::"Skin color, texture, turgor normal. No rashes or lesions"} Lymph nodes: {lymph node exam:14039::"Cervical, supraclavicular, and axillary nodes normal."} {Exam; lymph nodes inguinal:30852} Neurologic: {neuro exam:17854}   Pelvic: External genitalia:  {Exam; genitalia female:32129}              Urethra: {urethra:311719::"not indicated"}              Bartholins and Skenes: {EXAM; GYN JXBJY:78295}                 Vagina: {vagina:315903::"normal appearing vagina with normal color and discharge, no lesions"}              Cervix: {exam; gyn cervix:30847}              Pap taken: {yes no:314532}        Bimanual Exam:  Uterus:  {uterus:315905::"uterus is normal size, shape, consistency and nontender"}                                      Adnexa:    {adnexa:311645::"not indicated"}  Rectovaginal: {Rectovaginal:16320}                                      Anus:  {Exam; anus:16940}  A: {Gyn assessment:5268::"well woman"}     P: {plan; gyn:5269::"mammogram","pap smear","return annually or prn"}      An After Visit Summary was printed and given to the patient.

## 2012-04-29 NOTE — Progress Notes (Signed)
42 y.o.Single African American female G1P0010 with a history of  vaginal symptoms for about 2 week.  Itching and discharge initially and treated with OTC Monistat X 2.  She had been on oral antibiotics for a tooth and gum infection and felt symptoms were related. Now feels symptoms are more BV related.  Slight vag odor and just "uncomfortable".  She did note with use of Monistat that she had pink vaginal discharge.  Her Robotic Hysterectomy and BSO was 01/09/12 for uterine fibroids.  She is not sexually active.  Then BRRB X 2 weeks.  Symptoms initially felt to be related to straining with BM.  Blood on the stool and in the water. No history of hemorrhoids. No rectal trauma.  No chest pain or SOB. Occ rectal bleeding is darker or black looking?Marland Kitchen  No changes in diet or med's such as Pepto bismol.  As noted recent antibiotic use secondary to broken tooth with infection.      Associated with lower abdominal cramps with the harder stools but not otherwise. Some weakness and fatigue not sure if related.  Wants to sleep a lot - concerned that she may be anemic.    Exam:  OVF:IEPPIR                JJO:ACZYSAYTK: bloody, thin and watery                Cx:  absent and and presence of some granuation tissue with a small polyp type projection about 0.5 cm size. Area of granulation tissue is treated with Silver nitrate with Dr. Farrel Gobble in room and observed the area.                Uterus:absent                Adnexa: no masss  Wet Prep shows:clue cells, WBC's and  and RBC's  Anal scopic Procedure Note:  Pt was turned to left lateral decubitus position with knee chest position.  There is no external evidence of rectal bleeding, tears, or mass.  On digital internal exam there is a firm internal hemorrhoid. After gentle insertion of the anal scope she has a rectal hemorrhoid that fills the lumen of the end of scope.  No evidence of bleeding and no obvious discomfort. Pt. Tolerated the procedure  well.   Dx  Bacterial vaginosis  Recent oral antibiotics for tooth and gum infection  Granulation tissue posterior fornix following Robotic  Hysterectomy and BSO 01/09/12   BRRB new onset with findings of internal hemorrhoid  ZS:[id number] antibiotic see orders.  Metrogel vaginal Cream HS X 5  If symptoms not improved to call back   For the [facility] will get GI consult with Dr. Loreta Ave and follow  If she has any discomfort may use OTC hemorrhoid tx.

## 2012-04-29 NOTE — Patient Instructions (Addendum)

## 2012-05-01 NOTE — Progress Notes (Signed)
Encounter reviewed by Dr. Neri Vieyra Silva.  

## 2012-08-16 ENCOUNTER — Encounter: Payer: Self-pay | Admitting: *Deleted

## 2012-08-22 ENCOUNTER — Ambulatory Visit (INDEPENDENT_AMBULATORY_CARE_PROVIDER_SITE_OTHER): Payer: BC Managed Care – PPO | Admitting: Nurse Practitioner

## 2012-08-22 ENCOUNTER — Encounter: Payer: Self-pay | Admitting: Nurse Practitioner

## 2012-08-22 VITALS — BP 126/70 | HR 72 | Resp 14 | Ht 66.75 in | Wt 235.0 lb

## 2012-08-22 DIAGNOSIS — Z0000 Encounter for general adult medical examination without abnormal findings: Secondary | ICD-10-CM

## 2012-08-22 DIAGNOSIS — Z01419 Encounter for gynecological examination (general) (routine) without abnormal findings: Secondary | ICD-10-CM

## 2012-08-22 LAB — POCT URINALYSIS DIPSTICK
Spec Grav, UA: 1.02
pH, UA: 5.5

## 2012-08-22 LAB — HEMOGLOBIN, FINGERSTICK: Hemoglobin, fingerstick: 12.8 g/dL (ref 12.0–16.0)

## 2012-08-22 MED ORDER — VALACYCLOVIR HCL 1 G PO TABS: 1000.0000 mg | ORAL_TABLET | Freq: Every day | ORAL | Status: DC

## 2012-08-22 NOTE — Patient Instructions (Addendum)

## 2012-08-22 NOTE — Progress Notes (Signed)
42 y.o. [id number] Single African American Fe here for annual exam.  Since Robotic hysterectomy and salpingectomy  She has felt well. No hot flashes. No vaginal dryness.  Same partner X 3 years.  Patient's last menstrual period was 09/26/2011.  Post hysterectomy        Sexually active: yes  The current method of family planning is surgery and using condoms most of the time.    Exercising: no  The patient does not participate in regular exercise at present. Smoker:  no  Health Maintenance: Pap:  08/21/2011  Normal with negative HR HPV prior to surgery MMG:  10/19/2011 normal Colonoscopy:  never TDaP:  2008 Labs: Hgb- 12.4    reports that she has never smoked. She has never used smokeless tobacco. She reports that she does not drink alcohol or use illicit drugs.  Past Medical History  Diagnosis Date  . Fibroid uterus   . Migraine headache without aura   . No pertinent past medical history   . STD (sexually transmitted disease)     HSV II  . AC (acromioclavicular) joint bone spurs     left foot    Past Surgical History  Procedure Laterality Date  . Foot surgery  2013    bone spurs removed  . Robotic assisted total hysterectomy  01/09/2012    Procedure: ROBOTIC ASSISTED TOTAL HYSTERECTOMY;  Surgeon: Bennye Alm, MD;  Location: [facility];  Service: Gynecology;  Laterality: N/A;  . Bilateral salpingectomy  01/09/2012    Procedure: BILATERAL SALPINGECTOMY;  Surgeon: Bennye Alm, MD;  Location: [facility];  Service: Gynecology;  Laterality: Bilateral;  . Cystoscopy  01/09/2012    Procedure: CYSTOSCOPY;  Surgeon: Bennye Alm, MD;  Location: [facility];  Service: Gynecology;  Laterality: N/A;  . Fractured wrist Right 2008  . Removal of uterine fibroids      in pt's 20's     Current Outpatient Prescriptions  Medication Sig Dispense Refill  . benzoyl peroxide 10 % gel Apply topically daily.      . calcium carbonate (OS-CAL) 600 MG TABS Take 600 mg by mouth 2 (two) times daily with a  meal.      . cyanocobalamin 100 MCG tablet Take 100 mcg by mouth daily.      . fish oil-omega-3 fatty acids 1000 MG capsule Take 2 g by mouth daily.      . Multiple Vitamin (MULTIVITAMIN WITH MINERALS) TABS Take 1 tablet by mouth daily.      . valACYclovir (VALTREX) 1000 MG tablet Take 1 tablet (1,000 mg total) by mouth daily.  90 tablet  3   No current facility-administered medications for this visit.    Family History  Problem Relation Age of Onset  . Adopted: Yes    ROS:  Pertinent items are noted in HPI.  Otherwise, a comprehensive ROS was negative.  Exam:   BP 126/70  Pulse 72  Resp 14  Ht 5' 6.75" (1.695 m)  Wt 235 lb (106.595 kg)  BMI 37.1 kg/m2  LMP 09/26/2011 Height: 5' 6.75" (169.5 cm)  Ht Readings from Last 3 Encounters:  08/22/12 5' 6.75" (1.695 m)  01/09/12 5\' 7"  (1.702 m)  01/09/12 5\' 7"  (1.702 m)    General appearance: alert, cooperative and appears stated age Head: Normocephalic, without obvious abnormality, atraumatic Neck: no adenopathy, supple, symmetrical, trachea midline and thyroid normal to inspection and palpation Lungs: clear to auscultation bilaterally Breasts: normal appearance, no masses or tenderness Heart: regular rate and rhythm  Abdomen: soft, non-tender; no masses,  no organomegaly Extremities: extremities normal, atraumatic, no cyanosis or edema Skin: Skin color, texture, turgor normal. No rashes or lesions Lymph nodes: Cervical, supraclavicular, and axillary nodes normal. No abnormal inguinal nodes palpated Neurologic: Grossly normal   Pelvic: External genitalia:  no lesions              Urethra:  normal appearing urethra with no masses, tenderness or lesions              Bartholin's and Skene's: normal                 Vagina: normal appearing vagina with normal color and discharge, no lesions              Cervix: absent vaginal cuff well healed - 04/29/12 treated with silver nitrate to cuff secondary to granulation tissue.               Pap taken: no Bimanual Exam:  Uterus:  uterus absent              Adnexa: no mass, fullness, tenderness               Rectovaginal: Confirms               Anus:  normal sphincter tone, no lesions  A:  Well Woman with normal exam  S/P Robotic assisted TVH with Bilateral salpingectomy 01/09/12 secondary to uterine fibroids and AUB  History of HSV II  P:   Pap smear as per guidelines   Mammogram 08/2012   Refill on Valtrex for 1 year  counseled on adequate intake of calcium and vitamin D, diet and exercise return annually or prn  An After Visit Summary was printed and given to the patient.

## 2012-08-23 ENCOUNTER — Encounter: Payer: Self-pay | Admitting: Nurse Practitioner

## 2012-08-26 NOTE — Progress Notes (Signed)
Encounter reviewed by Dr. Darianny Momon Silva.  

## 2012-10-02 ENCOUNTER — Other Ambulatory Visit: Payer: Self-pay | Admitting: Nurse Practitioner

## 2012-10-02 DIAGNOSIS — Z1231 Encounter for screening mammogram for malignant neoplasm of breast: Secondary | ICD-10-CM

## 2012-10-17 ENCOUNTER — Ambulatory Visit (HOSPITAL_COMMUNITY)
Admission: RE | Admit: 2012-10-17 | Discharge: 2012-10-17 | Disposition: A | Discharge status: Home / Self Care | Payer: BC Managed Care – PPO | Source: Ambulatory Visit | Attending: Nurse Practitioner | Admitting: Nurse Practitioner

## 2012-10-17 DIAGNOSIS — Z1231 Encounter for screening mammogram for malignant neoplasm of breast: Secondary | ICD-10-CM | POA: Insufficient documentation

## 2013-08-04 DIAGNOSIS — IMO0002 Reserved for concepts with insufficient information to code with codable children: Secondary | ICD-10-CM | POA: Insufficient documentation

## 2013-08-04 DIAGNOSIS — R5383 Other fatigue: Secondary | ICD-10-CM | POA: Insufficient documentation

## 2013-08-04 DIAGNOSIS — L709 Acne, unspecified: Secondary | ICD-10-CM | POA: Insufficient documentation

## 2013-08-26 ENCOUNTER — Ambulatory Visit (INDEPENDENT_AMBULATORY_CARE_PROVIDER_SITE_OTHER): Payer: BC Managed Care – PPO | Admitting: Nurse Practitioner

## 2013-08-26 ENCOUNTER — Encounter: Payer: Self-pay | Admitting: Nurse Practitioner

## 2013-08-26 VITALS — BP 100/64 | HR 72 | Ht 66.75 in | Wt 236.0 lb

## 2013-08-26 DIAGNOSIS — Z01419 Encounter for gynecological examination (general) (routine) without abnormal findings: Secondary | ICD-10-CM

## 2013-08-26 DIAGNOSIS — Z0000 Encounter for general adult medical examination without abnormal findings: Secondary | ICD-10-CM

## 2013-08-26 DIAGNOSIS — R319 Hematuria, unspecified: Secondary | ICD-10-CM

## 2013-08-26 DIAGNOSIS — Z113 Encounter for screening for infections with a predominantly sexual mode of transmission: Secondary | ICD-10-CM

## 2013-08-26 LAB — POCT URINALYSIS DIPSTICK
Bilirubin, UA: NEGATIVE
Glucose, UA: NEGATIVE
Ketones, UA: NEGATIVE
LEUKOCYTES UA: NEGATIVE
NITRITE UA: NEGATIVE
PH UA: 6
PROTEIN UA: NEGATIVE
Urobilinogen, UA: NEGATIVE

## 2013-08-26 MED ORDER — VALACYCLOVIR HCL 1 G PO TABS: 1000.0000 mg | ORAL_TABLET | Freq: Every day | ORAL | Status: DC

## 2013-08-26 NOTE — Patient Instructions (Signed)

## 2013-08-26 NOTE — Progress Notes (Signed)
Patient ID: [name], female   DOB: 07/31/1970, 43 y.o.   MRN: [id number] 43 y.o. G2P0020 Single Caucasian Fe here for annual exam.  She is very happy to have connected to her birth mother this May.  She was able to go to [place] and petition to open her adoption records.  Her "mother"  who raised her is in assisted living and has Alzheimer.  Some vaso symptoms.  Same partner.  Patient's last menstrual period was 09/26/2011.          Sexually active: yes  The current method of family planning is surgery and using condoms most of the time.  Exercising: no The patient does not participate in regular exercise at present.  Smoker: no   Health Maintenance:  Pap: 08/21/2011 Normal with negative HR HPV prior to surgery  MMG: 10/19/2011 normal  Colonoscopy: never  TDaP: 2008  Labs:  HB:  12.8  Urine:  Trace RBC    reports that she has never smoked. She has never used smokeless tobacco. She reports that she does not drink alcohol or use illicit drugs.  Past Medical History  Diagnosis Date  . Fibroid uterus   . Migraine headache without aura   . No pertinent past medical history   . STD (sexually transmitted disease)     HSV II  . AC (acromioclavicular) joint bone spurs     left foot    Past Surgical History  Procedure Laterality Date  . Foot surgery  2013    bone spurs removed  . Robotic assisted total hysterectomy  01/09/2012    Procedure: ROBOTIC ASSISTED TOTAL HYSTERECTOMY;  Surgeon: Azalia Bilis, MD;  Location: [facility];  Service: Gynecology;  Laterality: N/A;  . Bilateral salpingectomy  01/09/2012    Procedure: BILATERAL SALPINGECTOMY;  Surgeon: Azalia Bilis, MD;  Location: [facility];  Service: Gynecology;  Laterality: Bilateral;  . Cystoscopy  01/09/2012    Procedure: CYSTOSCOPY;  Surgeon: Azalia Bilis, MD;  Location: [facility];  Service: Gynecology;  Laterality: N/A;  . Fractured wrist Right 2008  . Removal of uterine fibroids      in pt's 20's     Current Outpatient  Prescriptions  Medication Sig Dispense Refill  . cyanocobalamin 100 MCG tablet Take 100 mcg by mouth daily.      Marland Kitchen doxycycline (MONODOX) 100 MG capsule Take 100 mg by mouth 2 (two) times daily.      . Multiple Vitamin (MULTIVITAMIN WITH MINERALS) TABS Take 1 tablet by mouth daily.      . valACYclovir (VALTREX) 1000 MG tablet Take 1 tablet (1,000 mg total) by mouth daily.  90 tablet  3   No current facility-administered medications for this visit.    Family History  Problem Relation Age of Onset  . Adopted: Yes  . Asthma Mother   . Diabetes Mother     insulin dependent    ROS:  Pertinent items are noted in HPI.  Otherwise, a comprehensive ROS was negative.  Exam:   BP 100/64  Pulse 72  Ht 5' 6.75" (1.695 m)  Wt 236 lb (107.049 kg)  BMI 37.26 kg/m2  LMP 09/26/2011 Height: 5' 6.75" (169.5 cm)  Ht Readings from Last 3 Encounters:  08/26/13 5' 6.75" (1.695 m)  08/22/12 5' 6.75" (1.695 m)  01/09/12 5\' 7"  (1.702 m)    General appearance: alert, cooperative and appears stated age Head: Normocephalic, without obvious abnormality, atraumatic Neck: no adenopathy, supple, symmetrical, trachea midline and thyroid normal to  inspection and palpation Lungs: clear to auscultation bilaterally Breasts: normal appearance, no masses or tenderness Heart: regular rate and rhythm Abdomen: soft, non-tender; no masses,  no organomegaly Extremities: extremities normal, atraumatic, no cyanosis or edema Skin: Skin color, texture, turgor normal. No rashes or lesions Lymph nodes: Cervical, supraclavicular, and axillary nodes normal. No abnormal inguinal nodes palpated Neurologic: Grossly normal   Pelvic: External genitalia:  no lesions              Urethra:  normal appearing urethra with no masses, tenderness or lesions              Bartholin's and Skene's: normal                 Vagina: normal appearing vagina with normal color and discharge, no lesions              Cervix: absent               Pap taken: No. Bimanual Exam:  Uterus:  uterus absent              Adnexa: no mass, fullness, tenderness               Rectovaginal: Confirms               Anus:  normal sphincter tone, no lesions  A:  Well Woman with normal exam  Perimenopausal   S/P Robotic hysterectomy with Bilateral salpingectomy 2013 secondary to fibroids  History of HSV II  R/O STD's  R/O UTI- asymptomatic  P:   Reviewed health and wellness pertinent to exam  Pap smear not taken today  Mammogram is due now and will schedule  Refill on Valtrex for a year  Counseled on breast self exam, mammography screening, adequate intake of calcium and vitamin D, diet and exercise return annually or prn  An After Visit Summary was printed and given to the patient.

## 2013-08-27 LAB — URINALYSIS, MICROSCOPIC ONLY
Bacteria, UA: NONE SEEN
Casts: NONE SEEN
Crystals: NONE SEEN

## 2013-08-27 LAB — URINE CULTURE
Colony Count: NO GROWTH
Organism ID, Bacteria: NO GROWTH

## 2013-08-27 LAB — STD PANEL
HEP B S AG: NEGATIVE
HIV: NONREACTIVE

## 2013-08-27 LAB — HEMOGLOBIN, FINGERSTICK: Hemoglobin, fingerstick: 12.8 g/dL (ref 12.0–16.0)

## 2013-08-28 NOTE — Progress Notes (Signed)
Encounter reviewed by Dr. Brook Silva.  

## 2013-10-17 ENCOUNTER — Other Ambulatory Visit: Payer: Self-pay | Admitting: Nurse Practitioner

## 2013-10-17 DIAGNOSIS — Z1231 Encounter for screening mammogram for malignant neoplasm of breast: Secondary | ICD-10-CM

## 2013-10-29 ENCOUNTER — Ambulatory Visit (HOSPITAL_COMMUNITY)
Admission: RE | Admit: 2013-10-29 | Discharge: 2013-10-29 | Disposition: A | Discharge status: Home / Self Care | Payer: BC Managed Care – PPO | Source: Ambulatory Visit | Attending: Nurse Practitioner | Admitting: Nurse Practitioner

## 2013-10-29 DIAGNOSIS — Z1231 Encounter for screening mammogram for malignant neoplasm of breast: Secondary | ICD-10-CM | POA: Diagnosis present

## 2013-12-01 ENCOUNTER — Encounter: Payer: Self-pay | Admitting: Nurse Practitioner

## 2014-04-10 ENCOUNTER — Emergency Department (HOSPITAL_BASED_OUTPATIENT_CLINIC_OR_DEPARTMENT_OTHER): Payer: BLUE CROSS/BLUE SHIELD

## 2014-04-10 ENCOUNTER — Emergency Department (HOSPITAL_BASED_OUTPATIENT_CLINIC_OR_DEPARTMENT_OTHER)
Admission: EM | Admit: 2014-04-10 | Discharge: 2014-04-11 | Disposition: A | Discharge status: Home / Self Care | Payer: BLUE CROSS/BLUE SHIELD | Attending: Emergency Medicine | Admitting: Emergency Medicine

## 2014-04-10 ENCOUNTER — Encounter (HOSPITAL_BASED_OUTPATIENT_CLINIC_OR_DEPARTMENT_OTHER): Payer: Self-pay | Admitting: *Deleted

## 2014-04-10 DIAGNOSIS — Z8679 Personal history of other diseases of the circulatory system: Secondary | ICD-10-CM | POA: Insufficient documentation

## 2014-04-10 DIAGNOSIS — Z79899 Other long term (current) drug therapy: Secondary | ICD-10-CM | POA: Insufficient documentation

## 2014-04-10 DIAGNOSIS — R51 Headache: Secondary | ICD-10-CM | POA: Diagnosis not present

## 2014-04-10 DIAGNOSIS — N8320 Unspecified ovarian cysts: Secondary | ICD-10-CM | POA: Insufficient documentation

## 2014-04-10 DIAGNOSIS — Z90710 Acquired absence of both cervix and uterus: Secondary | ICD-10-CM | POA: Insufficient documentation

## 2014-04-10 DIAGNOSIS — N83202 Unspecified ovarian cyst, left side: Secondary | ICD-10-CM

## 2014-04-10 DIAGNOSIS — Z91040 Latex allergy status: Secondary | ICD-10-CM | POA: Diagnosis not present

## 2014-04-10 DIAGNOSIS — R1032 Left lower quadrant pain: Secondary | ICD-10-CM

## 2014-04-10 LAB — COMPREHENSIVE METABOLIC PANEL
ALBUMIN: 3.7 g/dL (ref 3.5–5.2)
ALK PHOS: 63 U/L (ref 39–117)
ALT: 19 U/L (ref 0–35)
AST: 15 U/L (ref 0–37)
Anion gap: 8 (ref 5–15)
BUN: 12 mg/dL (ref 6–23)
CHLORIDE: 102 mmol/L (ref 96–112)
CO2: 27 mmol/L (ref 19–32)
Calcium: 8.9 mg/dL (ref 8.4–10.5)
Creatinine, Ser: 0.8 mg/dL (ref 0.50–1.10)
GFR calc Af Amer: >90 mL/min (ref >90)
GFR calc non Af Amer: 87 mL/min — ABNORMAL LOW (ref >90)
GLUCOSE: 93 mg/dL (ref 70–99)
POTASSIUM: 4.2 mmol/L (ref 3.5–5.1)
Sodium: 137 mmol/L (ref 135–145)
Total Bilirubin: 0.1 mg/dL — ABNORMAL LOW (ref 0.3–1.2)
Total Protein: 7.4 g/dL (ref 6.0–8.3)

## 2014-04-10 LAB — CBC WITH DIFFERENTIAL/PLATELET
Basophils Absolute: 0 10*3/uL (ref 0.0–0.1)
Basophils Relative: 0 % (ref 0–1)
Eosinophils Absolute: 0.1 10*3/uL (ref 0.0–0.7)
Eosinophils Relative: 1 % (ref 0–5)
HCT: 37.3 % (ref 36.0–46.0)
Hemoglobin: 12.3 g/dL (ref 12.0–15.0)
Lymphocytes Relative: 37 % (ref 12–46)
Lymphs Abs: 2.7 10*3/uL (ref 0.7–4.0)
MCH: 29.9 pg (ref 26.0–34.0)
MCHC: 33 g/dL (ref 30.0–36.0)
MCV: 90.5 fL (ref 78.0–100.0)
MONO ABS: 0.7 10*3/uL (ref 0.1–1.0)
MONOS PCT: 9 % (ref 3–12)
Neutro Abs: 3.8 10*3/uL (ref 1.7–7.7)
Neutrophils Relative %: 53 % (ref 43–77)
PLATELETS: 236 10*3/uL (ref 150–400)
RBC: 4.12 MIL/uL (ref 3.87–5.11)
RDW: 13.2 % (ref 11.5–15.5)
WBC: 7.2 10*3/uL (ref 4.0–10.5)

## 2014-04-10 LAB — URINALYSIS, ROUTINE W REFLEX MICROSCOPIC
Bilirubin Urine: NEGATIVE
Glucose, UA: NEGATIVE mg/dL
HGB URINE DIPSTICK: NEGATIVE
Ketones, ur: NEGATIVE mg/dL
LEUKOCYTES UA: NEGATIVE
Nitrite: NEGATIVE
PH: 6 (ref 5.0–8.0)
Protein, ur: NEGATIVE mg/dL
SPECIFIC GRAVITY, URINE: 1.017 (ref 1.005–1.030)
Urobilinogen, UA: 0.2 mg/dL (ref 0.0–1.0)

## 2014-04-10 LAB — LIPASE, BLOOD: LIPASE: 31 U/L (ref 11–59)

## 2014-04-10 MED ORDER — IOHEXOL 300 MG/ML  SOLN
100.0000 mL | Freq: Once | INTRAMUSCULAR | Status: AC | PRN
  Administered 2014-04-10: 100 mL via INTRAVENOUS

## 2014-04-10 MED ORDER — SODIUM CHLORIDE 0.9 % IV BOLUS (SEPSIS)
1000.0000 mL | Freq: Once | INTRAVENOUS | Status: AC
  Administered 2014-04-10: 1000 mL via INTRAVENOUS

## 2014-04-10 MED ORDER — KETOROLAC TROMETHAMINE 30 MG/ML IJ SOLN
30.0000 mg | Freq: Once | INTRAMUSCULAR | Status: AC
  Administered 2014-04-11: 30 mg via INTRAVENOUS
  Filled 2014-04-10: qty 1

## 2014-04-10 MED ORDER — HYDROCODONE-ACETAMINOPHEN 5-325 MG PO TABS: 1.0000 | ORAL_TABLET | Freq: Four times a day (QID) | ORAL | Status: DC | PRN

## 2014-04-10 MED ORDER — IOHEXOL 300 MG/ML  SOLN
50.0000 mL | Freq: Once | INTRAMUSCULAR | Status: AC | PRN
  Administered 2014-04-10: 50 mL via ORAL

## 2014-04-10 NOTE — ED Notes (Signed)
Pain in her left abdomen since last night. Feels like gas.

## 2014-04-10 NOTE — ED Provider Notes (Addendum)
CSN: [id number]     Arrival date & time 04/10/14  1828 History  This chart was scribed for Blanchie Dessert, MD by Delphia Grates, ED Scribe. This patient was seen in room MH06/MH06 and the patient's care was started at 8:44 PM.    Chief Complaint  Patient presents with  . Abdominal Pain   The history is provided by the patient. No language interpreter was used.     HPI Comments: [name] is a 44 y.o. female who presents to the Emergency Department complaining of suddden onset constant, left lateral abdominal pain that began last night. Patient reports the pain woke her from sleep and notes intermittent radiation to the LLQ of the abdomen. There is an associated mild HA. Patient rates the pain a 5/10 at rest and a 9/10 with movement. She denies history of prior similar episodes. She states the pain is exacerbated when eating, standing, ambulating, or any twisting or movement at the abdomen. Patient reports past surgical history of hysterectomy. She denies fever, nausea, vomiting, diarrhea, or any urinary symptoms.   Past Medical History  Diagnosis Date  . Fibroid uterus   . Migraine headache without aura   . No pertinent past medical history   . STD (sexually transmitted disease)     HSV II  . 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 Foot surgery  2013    bone spurs removed  . Robotic assisted total hysterectomy  01/09/2012    Procedure: ROBOTIC ASSISTED TOTAL HYSTERECTOMY;  Surgeon: Azalia Bilis, MD;  Location: [facility];  Service: Gynecology;  Laterality: N/A;  . Bilateral salpingectomy  01/09/2012    Procedure: BILATERAL SALPINGECTOMY;  Surgeon: Azalia Bilis, MD;  Location: [facility];  Service: Gynecology;  Laterality: Bilateral;  . Cystoscopy  01/09/2012    Procedure: CYSTOSCOPY;  Surgeon: Azalia Bilis, MD;  Location: [facility];  Service: Gynecology;  Laterality: N/A;  . Fractured wrist Right 2008  .  Removal of uterine fibroids      in pt's 20's   . Eye surgery     Family History  Problem Relation Age of Onset  . Adopted: Yes  . Asthma Mother   . Diabetes Mother     insulin dependent   History  Substance Use Topics  . Smoking status: Never Smoker   . Smokeless tobacco: Never Used  . Alcohol Use: No   OB History    Gravida Para Term Preterm AB TAB SAB Ectopic Multiple Living   2 0 0 0 2 0 0 0 0 0      Review of Systems  Gastrointestinal: Positive for abdominal pain.  Neurological: Positive for headaches.  All other systems reviewed and are negative.     Allergies  Latex  Home Medications   Prior to Admission medications   Medication Sig Start Date End Date Taking? Authorizing Provider  cyanocobalamin 100 MCG tablet Take 100 mcg by mouth daily.    Historical Provider, MD  Multiple Vitamin (MULTIVITAMIN WITH MINERALS) TABS Take 1 tablet by mouth daily.    Historical Provider, MD  valACYclovir (VALTREX) 1000 MG tablet Take 1 tablet (1,000 mg total) by mouth daily. 08/26/13   Antonietta Barcelona, FNP   Triage Vitals: BP 113/70 mmHg  Pulse 70  Temp(Src) 97.5 F (36.4 C) (Oral)  Resp 18  SpO2 100%  LMP 09/26/2011  Physical Exam  Constitutional: She is oriented to person,  place, and time. She appears well-developed and well-nourished. No distress.  HENT:  Head: Normocephalic and atraumatic.  Eyes: Conjunctivae and EOM are normal.  Neck: Neck supple. No tracheal deviation present.  Cardiovascular: Normal rate, regular rhythm and normal heart sounds.   Pulmonary/Chest: Effort normal and breath sounds normal. No respiratory distress.  Abdominal: Soft. Bowel sounds are normal. There is tenderness. There is rebound and guarding.  Left mid and lower abdominal tenderness with mild guarding and rebound.  Musculoskeletal: Normal range of motion.  Neurological: She is alert and oriented to person, place, and time.  Skin: Skin is warm and dry.  Psychiatric: She has a normal  mood and affect. Her behavior is normal.  Nursing note and vitals reviewed.   ED Course  Procedures (including critical care time)  DIAGNOSTIC STUDIES: Oxygen Saturation is 100% on room air, normal by my interpretation.    COORDINATION OF CARE: At 2049 Discussed treatment plan with patient which includes imaging. Patient agrees.   Labs Review Labs Reviewed  COMPREHENSIVE METABOLIC PANEL - Abnormal; Notable for the following:    Total Bilirubin 0.1 (*)    GFR calc non Af Amer 87 (*)    All other components within normal limits  URINALYSIS, ROUTINE W REFLEX MICROSCOPIC  CBC WITH DIFFERENTIAL/PLATELET  LIPASE, BLOOD    Imaging Review Ct Abdomen Pelvis W Contrast  04/10/2014   CLINICAL DATA:  Acute onset lateral abdominal pain 24 hours ago. Pain is worse with eating, standing, ambulation or any motion.Headache.  EXAM: CT ABDOMEN AND PELVIS WITH CONTRAST  TECHNIQUE: Multidetector CT imaging of the abdomen and pelvis was performed using the standard protocol following bolus administration of intravenous contrast.  CONTRAST:  50mL OMNIPAQUE IOHEXOL 300 MG/ML SOLN, 141mL OMNIPAQUE IOHEXOL 300 MG/ML SOLN  COMPARISON:  None.  FINDINGS: LUNG BASES: Included view of the lung bases are clear. Visualized heart and pericardium are unremarkable.  SOLID ORGANS: The spleen, gallbladder, pancreas and adrenal glands are unremarkable. Sub cm cyst in dome of the liver, liver is otherwise unremarkable.  GASTROINTESTINAL TRACT: Small hiatal hernia. The stomach, small and large bowel are normal in course and caliber without inflammatory changes. Normal appendix.  KIDNEYS/ URINARY TRACT: Kidneys are orthotopic, demonstrating symmetric enhancement. No nephrolithiasis, hydronephrosis or solid renal masses. 11 mm LEFT interpolar cyst. The unopacified ureters are normal in course and caliber. Delayed imaging through the kidneys demonstrates symmetric prompt contrast excretion within the proximal urinary collecting  system. Urinary bladder is partially distended and unremarkable.  PERITONEUM/RETROPERITONEUM: Aortoiliac vessels are normal in course and caliber, minimal calcific atherosclerosis. No lymphadenopathy by CT size criteria. Status post hysterectomy. 3.7 cm LEFT adnexal hypodensity (27 Hounsfield units). No intraperitoneal free fluid nor free air.  SOFT TISSUE/OSSEOUS STRUCTURES: Non-suspicious. Small fat containing umbilical hernia. A tear abdominal wall scarring.  IMPRESSION: 3.7 cm LEFT adnexal low-density lesion (may reflect a complex/hemorrhagic cyst). As patient reports LEFT lower quadrant pain, consider further characterization with pelvic ultrasound.   Electronically Signed   By: Elon Alas   On: 04/10/2014 23:00     EKG Interpretation None      MDM   Final diagnoses:  LLQ pain  Left ovarian cyst   patient with persistent pain in the left abdomen for last 2 days that's worse with movement. She denies any fever, nausea, vomiting, diarrhea. No problems with urination. Patient has had a prior hysterectomy and has not noted any vaginal bleeding. On exam patient has significant left lower quadrant tenderness with mild guarding. Concern for  ovarian pathology versus diverticulitis versus kidney stone. CBC, CMP, lipase, UA all within normal limits. CT scan to rule out diverticulitis showed findings consistent with a left ovarian cyst. Discussed findings with the patient. [facility] 1-2 weeks for repeat imaging to ensure cyst is improving. At this time low suspicion for torsion.  I personally performed the services described in this documentation, which was scribed in my presence.  The recorded information has been reviewed and considered.    Blanchie Dessert, MD 04/10/14 [place], MD 04/10/14 2352

## 2014-04-13 ENCOUNTER — Telehealth: Payer: Self-pay | Admitting: Nurse Practitioner

## 2014-04-13 NOTE — Telephone Encounter (Signed)
Patient was seen at urgent care in [place] and was told she has a cyst on her ovaries. She wants to schedule an ultrasound.

## 2014-04-13 NOTE — Telephone Encounter (Signed)
Left message to call Kaitlyn at [phone]. 

## 2014-04-13 NOTE — Telephone Encounter (Signed)
Patient was seen on 04/10/2014 in the ED for left lower quadrant pain. Notes are available in EPIC. Patient had CT abdomen pelvis with contrast performed which showed left ovarian cyst (results in EPIC). Patient was advised to follow up with GYN for repeat PUS in 1-2 weeks.   Dr.Silva, Okay to proceed with scheduling follow up PUS?

## 2014-04-13 NOTE — Telephone Encounter (Signed)
Please have the patient schedule an appointment.  We have not seen her since July 2015.  We need to be able to examine her and assess her not just do a pelvic ultrasound.

## 2014-04-14 NOTE — Telephone Encounter (Signed)
Spoke with patient. Advised of message as seen below from [place]. Patient is agreeable. Requesting afternoon appointment after 2pm. Appointment scheduled for 3/23 at 2:45pm with Dr.Silva. Patient is agreeable to date and time.  Routing to provider for final review. Patient agreeable to disposition. Will close encounter.

## 2014-04-22 ENCOUNTER — Encounter: Payer: Self-pay | Admitting: Obstetrics and Gynecology

## 2014-04-22 ENCOUNTER — Ambulatory Visit (INDEPENDENT_AMBULATORY_CARE_PROVIDER_SITE_OTHER): Payer: BLUE CROSS/BLUE SHIELD | Admitting: Obstetrics and Gynecology

## 2014-04-22 VITALS — BP 110/68 | HR 70 | Ht 66.75 in | Wt 228.4 lb

## 2014-04-22 DIAGNOSIS — N83202 Unspecified ovarian cyst, left side: Secondary | ICD-10-CM

## 2014-04-22 DIAGNOSIS — N8320 Unspecified ovarian cysts: Secondary | ICD-10-CM | POA: Diagnosis not present

## 2014-04-22 NOTE — Progress Notes (Signed)
Patient ID: [name], female   DOB: [birth year], 44 y.o.   MRN: [id number] GYNECOLOGY VISIT  PCP:   Idamae Schuller, MD  Referring provider:   HPI: 44 y.o.   Single  African American  female   [id number] with Patient's last menstrual period was 09/26/2011.   here for evaluation of LLQ pain and left ovarian cyst noted on CT scan.  Had sudden shooting pain in her left side for 3 days. Took Gas X and pain did not resolve. Pain would come and go.  Movement made the pain worse. Seen at the [facility] with [facility] and she had CT showing cyst measuring 3.7 cm.  No associated nausea, vomiting, diarrhea, or fever.  Pain resolved after 48 hours.   Status post robotic hysterectomy with bilateral salpingectomy in December 2013.  Pathology showed fibroids and adenomyosis.  Ovaries were not removed.   Prior ultrasound in November 2013 showed a left ovarian cyst measuring 24 x 19 mm.  Had a thin septum in it.  This was not removed at hysterectomy.     Having numbness in her left foot.  Had a biopsy of her foot which was normal.   Works as Scientist, research (medical) at [employer].  GYNECOLOGIC HISTORY: Patient's last menstrual period was 09/26/2011. Sexually active:  no Partner preference: female Contraception:  Hysterectomy Menopausal hormone therapy: no DES exposure:  unusre (foster child)  Blood transfusions:  no  Sexually transmitted diseases:   HSV II GYN procedures and prior surgeries:  Myomectomy, R-TLH and bilateral salpingectomy Last mammogram:  10-29-13 fibroglandular density/nl:[facility]               Last pap and high risk HPV testing: 07/2011 wnl:neg HR HPV History of abnormal pap smear:  Yes, years ago but never had a colposcopy or treatment to cervix per patient   OB History    Gravida Para Term Preterm AB TAB SAB Ectopic Multiple Living   2 0 0 0 2 0 0 0 0 0        Past Medical History  Diagnosis Date  . Fibroid uterus   . Migraine headache without aura    . No pertinent past medical history   . STD (sexually transmitted disease)     HSV II  . AC (acromioclavicular) joint bone spurs     left foot    Past Surgical History  Procedure Laterality Date  . Foot surgery  2013    bone spurs removed  . Robotic assisted total hysterectomy  01/09/2012    Procedure: ROBOTIC ASSISTED TOTAL HYSTERECTOMY;  Surgeon: Azalia Bilis, MD;  Location: [facility];  Service: Gynecology;  Laterality: N/A;  . Bilateral salpingectomy  01/09/2012    Procedure: BILATERAL SALPINGECTOMY;  Surgeon: Azalia Bilis, MD;  Location: [facility];  Service: Gynecology;  Laterality: Bilateral;  . Cystoscopy  01/09/2012    Procedure: CYSTOSCOPY;  Surgeon: Azalia Bilis, MD;  Location: [facility];  Service: Gynecology;  Laterality: N/A;  . Fractured wrist Right 2008  . Removal of uterine fibroids      in pt's 20's   . Eye surgery      Current Outpatient Prescriptions  Medication Sig Dispense Refill  . cyanocobalamin 100 MCG tablet Take 100 mcg by mouth daily.    Marland Kitchen doxycycline (MONODOX) 100 MG capsule   2  . HYDROcodone-acetaminophen (NORCO/VICODIN) 5-325 MG per tablet Take 1 tablet by mouth every 6 (six) hours as needed for severe pain. 10 tablet 0  .  Multiple Vitamin (MULTIVITAMIN WITH MINERALS) TABS Take 1 tablet by mouth daily.    . valACYclovir (VALTREX) 1000 MG tablet Take 1 tablet (1,000 mg total) by mouth daily. 90 tablet 3   No current facility-administered medications for this visit.     ALLERGIES: Latex  Family History  Problem Relation Age of Onset  . Adopted: Yes  . Asthma Mother   . Diabetes Mother     insulin dependent    History   Social History  . Marital Status: Single    Spouse Name: N/A  . Number of Children: N/A  . Years of Education: N/A   Occupational History  . Not on file.   Social History Main Topics  . Smoking status: Never Smoker   . Smokeless tobacco: Never Used  . Alcohol Use: No  . Drug Use: No  . Sexual Activity: Yes     Birth Control/ Protection: Condom     Comment: R-TLH 12/2011   Other Topics Concern  . Not on file   Social History Narrative    ROS:  Pertinent items are noted in HPI.  PHYSICAL EXAMINATION:    BP 110/68 mmHg  Pulse 70  Ht 5' 6.75" (1.695 m)  Wt 228 lb 6.4 oz (103.602 kg)  BMI 36.06 kg/m2  LMP 09/26/2011   Wt Readings from Last 3 Encounters:  04/22/14 228 lb 6.4 oz (103.602 kg)  08/26/13 236 lb (107.049 kg)  08/22/12 235 lb (106.595 kg)     Ht Readings from Last 3 Encounters:  04/22/14 5' 6.75" (1.695 m)  08/26/13 5' 6.75" (1.695 m)  08/22/12 5' 6.75" (1.695 m)    General appearance: alert, cooperative and appears stated age Head: Normocephalic, without obvious abnormality, atraumatic Neck: no adenopathy, supple, symmetrical, trachea midline and thyroid not enlarged, symmetric, no tenderness/mass/nodules Lungs: clear to auscultation bilaterally Heart: regular rate and rhythm Abdomen: soft, non-tender; no masses,  no organomegaly Extremities: extremities normal, atraumatic, no cyanosis or edema Skin: Skin color, texture, turgor normal. No rashes or lesions Lymph nodes: Cervical, supraclavicular, and axillary nodes normal. No abnormal inguinal nodes palpated    Pelvic: External genitalia:  no lesions              Urethra:  normal appearing urethra with no masses, tenderness or lesions              Bartholins and Skenes: normal                 Vagina: normal appearing vagina with normal color and discharge, no lesions              Cervix: absent                 Bimanual Exam:  Uterus:   absent                                      Adnexa: normal adnexa in size, nontender and no masses                                      Rectal exam:  No masses.  ASSESSMENT  Status post robotic hysterectomy with bilateral salpingectomy.  LLQ pain episode.  Left ovarian cyst on CT scan.   PLAN  Discussion of ovarian cysts and prior ultrasound done pre hysterectomy.  Will  proceed with pelvic ultrasound to further evaluate the left ovarian cyst.   An After Visit Summary was printed and given to the patient.  25 minutes face to face time of which over 50% was spent in counseling.

## 2014-04-22 NOTE — Patient Instructions (Signed)
Ovarian Cyst An ovarian cyst is a fluid-filled sac that forms on an ovary. The ovaries are small organs that produce eggs in women. Various types of cysts can form on the ovaries. Most are not cancerous. Many do not cause problems, and they often go away on their own. Some may cause symptoms and require treatment. Common types of ovarian cysts include:  Functional cysts--These cysts may occur every month during the menstrual cycle. This is normal. The cysts usually go away with the next menstrual cycle if the woman does not get pregnant. Usually, there are no symptoms with a functional cyst.  Endometrioma cysts--These cysts form from the tissue that lines the uterus. They are also called "chocolate cysts" because they become filled with blood that turns brown. This type of cyst can cause pain in the lower abdomen during intercourse and with your menstrual period.  Cystadenoma cysts--This type develops from the cells on the outside of the ovary. These cysts can get very big and cause lower abdomen pain and pain with intercourse. This type of cyst can twist on itself, cut off its blood supply, and cause severe pain. It can also easily rupture and cause a lot of pain.  Dermoid cysts--This type of cyst is sometimes found in both ovaries. These cysts may contain different kinds of body tissue, such as skin, teeth, hair, or cartilage. They usually do not cause symptoms unless they get very big.  Theca lutein cysts--These cysts occur when too much of a certain hormone (human chorionic gonadotropin) is produced and overstimulates the ovaries to produce an egg. This is most common after procedures used to assist with the conception of a baby (in vitro fertilization). CAUSES   Fertility drugs can cause a condition in which multiple large cysts are formed on the ovaries. This is called ovarian hyperstimulation syndrome.  A condition called polycystic ovary syndrome can cause hormonal imbalances that can lead to  nonfunctional ovarian cysts. SIGNS AND SYMPTOMS  Many ovarian cysts do not cause symptoms. If symptoms are present, they may include:  Pelvic pain or pressure.  Pain in the lower abdomen.  Pain during sexual intercourse.  Increasing girth (swelling) of the abdomen.  Abnormal menstrual periods.  Increasing pain with menstrual periods.  Stopping having menstrual periods without being pregnant. DIAGNOSIS  These cysts are commonly found during a routine or annual pelvic exam. Tests may be ordered to find out more about the cyst. These tests may include:  Ultrasound.  X-ray of the pelvis.  CT scan.  MRI.  Blood tests. TREATMENT  Many ovarian cysts go away on their own without treatment. Your health care provider may want to check your cyst regularly for 2-3 months to see if it changes. For women in menopause, it is particularly important to monitor a cyst closely because of the higher rate of ovarian cancer in menopausal women. When treatment is needed, it may include any of the following:  A procedure to drain the cyst (aspiration). This may be done using a long needle and ultrasound. It can also be done through a laparoscopic procedure. This involves using a thin, lighted tube with a tiny camera on the end (laparoscope) inserted through a small incision.  Surgery to remove the whole cyst. This may be done using laparoscopic surgery or an open surgery involving a larger incision in the lower abdomen.  Hormone treatment or birth control pills. These methods are sometimes used to help dissolve a cyst. HOME CARE INSTRUCTIONS   Only take over-the-counter   or prescription medicines as directed by your health care provider.  Follow up with your health care provider as directed.  Get regular pelvic exams and Pap tests. SEEK MEDICAL CARE IF:   Your periods are late, irregular, or painful, or they stop.  Your pelvic pain or abdominal pain does not go away.  Your abdomen becomes  larger or swollen.  You have pressure on your bladder or trouble emptying your bladder completely.  You have pain during sexual intercourse.  You have feelings of fullness, pressure, or discomfort in your stomach.  You lose weight for no apparent reason.  You feel generally ill.  You become constipated.  You lose your appetite.  You develop acne.  You have an increase in body and facial hair.  You are gaining weight, without changing your exercise and eating habits.  You think you are pregnant. SEEK IMMEDIATE MEDICAL CARE IF:   You have increasing abdominal pain.  You feel sick to your stomach (nauseous), and you throw up (vomit).  You develop a fever that comes on suddenly.  You have abdominal pain during a bowel movement.  Your menstrual periods become heavier than usual. MAKE SURE YOU:  Understand these instructions.  Will watch your condition.  Will get help right away if you are not doing well or get worse. Document Released: 01/16/2005 Document Revised: 01/21/2013 Document Reviewed: 09/23/2012 ExitCare Patient Information 2015 [employer]. This information is not intended to replace advice given to you by your health care provider. Make sure you discuss any questions you have with your health care provider.  

## 2014-04-27 ENCOUNTER — Telehealth: Payer: Self-pay | Admitting: Obstetrics and Gynecology

## 2014-04-27 NOTE — Telephone Encounter (Signed)
Left message for patient to call back. Need to go over benefits and schedule PUS °

## 2014-04-28 NOTE — Telephone Encounter (Signed)
Return call to [place].

## 2014-04-28 NOTE — Telephone Encounter (Signed)
Returned call to patient. Advised of benefit quote received for PUS.  Patient agreeable. Scheduled PUS. Advised patient of 72 hour cancellation policy and $466 cancellation fee. Patient agreeable.

## 2014-05-07 ENCOUNTER — Ambulatory Visit (INDEPENDENT_AMBULATORY_CARE_PROVIDER_SITE_OTHER): Payer: BLUE CROSS/BLUE SHIELD

## 2014-05-07 ENCOUNTER — Ambulatory Visit (INDEPENDENT_AMBULATORY_CARE_PROVIDER_SITE_OTHER): Payer: BLUE CROSS/BLUE SHIELD | Admitting: Obstetrics and Gynecology

## 2014-05-07 ENCOUNTER — Encounter: Payer: Self-pay | Admitting: Obstetrics and Gynecology

## 2014-05-07 VITALS — BP 112/68 | Wt 232.0 lb

## 2014-05-07 DIAGNOSIS — N83202 Unspecified ovarian cyst, left side: Secondary | ICD-10-CM

## 2014-05-07 DIAGNOSIS — N8320 Unspecified ovarian cysts: Secondary | ICD-10-CM

## 2014-05-07 NOTE — Progress Notes (Signed)
Subjective   Patient is here for pelvic ultrasound to check left ovarian cyst noted on CT scan ordered in ER on 04/10/14 for LLQ pain.  Pain lasted for about 48 hours.   Objective  Pelvic ultrasound - images and report reviewed with patient.   Uterus - absent.  Left ovary with 17 mm smooth walled cyst with internal echoes, avascuar.  Right ovary with 15 mm collapsed CL cyst.  No free fluid.      Assessment   Left ovarian cyst resolving.  Right corpus luteum cyst.   Plan  Ovarian cysts discussed.  Observation for future cysts.  No further evaluation or treatment needed.  Follow up for annual exams and prn.   15 minutes face to face time of which over 50% was spent in counseling.   After visit summary to patient.

## 2014-08-28 ENCOUNTER — Ambulatory Visit: Payer: BLUE CROSS/BLUE SHIELD | Admitting: Nurse Practitioner

## 2014-08-28 ENCOUNTER — Encounter: Payer: Self-pay | Admitting: Nurse Practitioner

## 2014-10-08 ENCOUNTER — Other Ambulatory Visit: Payer: Self-pay

## 2014-10-08 DIAGNOSIS — Z1231 Encounter for screening mammogram for malignant neoplasm of breast: Secondary | ICD-10-CM

## 2014-11-02 ENCOUNTER — Ambulatory Visit
Admission: RE | Admit: 2014-11-02 | Discharge: 2014-11-02 | Disposition: A | Discharge status: Home / Self Care | Payer: BLUE CROSS/BLUE SHIELD | Source: Ambulatory Visit

## 2014-11-02 DIAGNOSIS — Z1231 Encounter for screening mammogram for malignant neoplasm of breast: Secondary | ICD-10-CM

## 2014-11-04 ENCOUNTER — Ambulatory Visit (INDEPENDENT_AMBULATORY_CARE_PROVIDER_SITE_OTHER): Payer: BLUE CROSS/BLUE SHIELD | Admitting: Obstetrics and Gynecology

## 2014-11-04 ENCOUNTER — Ambulatory Visit: Payer: BLUE CROSS/BLUE SHIELD | Admitting: Nurse Practitioner

## 2014-11-04 ENCOUNTER — Encounter: Payer: Self-pay | Admitting: Obstetrics and Gynecology

## 2014-11-04 ENCOUNTER — Ambulatory Visit: Payer: BLUE CROSS/BLUE SHIELD | Admitting: Certified Nurse Midwife

## 2014-11-04 VITALS — BP 112/70 | HR 70 | Resp 14 | Wt 242.0 lb

## 2014-11-04 DIAGNOSIS — Z0000 Encounter for general adult medical examination without abnormal findings: Secondary | ICD-10-CM | POA: Diagnosis not present

## 2014-11-04 DIAGNOSIS — R5383 Other fatigue: Secondary | ICD-10-CM

## 2014-11-04 DIAGNOSIS — Z01419 Encounter for gynecological examination (general) (routine) without abnormal findings: Secondary | ICD-10-CM

## 2014-11-04 DIAGNOSIS — E559 Vitamin D deficiency, unspecified: Secondary | ICD-10-CM

## 2014-11-04 DIAGNOSIS — Z1272 Encounter for screening for malignant neoplasm of vagina: Secondary | ICD-10-CM

## 2014-11-04 DIAGNOSIS — R0683 Snoring: Secondary | ICD-10-CM | POA: Diagnosis not present

## 2014-11-04 DIAGNOSIS — N898 Other specified noninflammatory disorders of vagina: Secondary | ICD-10-CM | POA: Diagnosis not present

## 2014-11-04 DIAGNOSIS — Z113 Encounter for screening for infections with a predominantly sexual mode of transmission: Secondary | ICD-10-CM | POA: Diagnosis not present

## 2014-11-04 LAB — POCT URINALYSIS DIPSTICK
BILIRUBIN UA: NEGATIVE
GLUCOSE UA: NEGATIVE
KETONES UA: NEGATIVE
NITRITE UA: NEGATIVE
Protein, UA: NEGATIVE
RBC UA: NEGATIVE
Urobilinogen, UA: NEGATIVE
pH, UA: 5

## 2014-11-04 MED ORDER — VALACYCLOVIR HCL 1 G PO TABS: 1000.0000 mg | ORAL_TABLET | Freq: Every day | ORAL | Status: DC

## 2014-11-04 NOTE — Patient Instructions (Signed)

## 2014-11-04 NOTE — Progress Notes (Signed)
44 y.o. [id number] Single Caucasian female here for annual exam.  The patient has a h/o a TLH/BS. She c/o an intermittent creamy clear vaginal d/c for the last month. No odor. Occasional irritation and itching. Sexually active, same partner x 3 years, mostly using condoms. Desires STD testing. She c/o weight gain since her hysterectomy, she has seen a nutritionist and MD for weight. Had blood work, vit D def, B12 def. She does snore, hasn't been evaluated for sleep apnea. She found her birth mom a year ago, she has a lot of health problems, asthma, sleep apnea, diabetes. She denies depression. Some mood changes. She does c/o night sweats, intermittently, tolerable. No hot flashes. No vaginal dryness.   PCP:   Dr. Derenda Mis  Patient's last menstrual period was 09/26/2011.          Sexually active: Yes.    The current method of family planning is condoms most of the time.    Exercising: No.  n/a Smoker:  no  Health Maintenance: Pap:  08/21/11 normal with neg HR HPV prior to surgery History of abnormal Pap:  leep in her early 20's, no other surgical procedures on her cervix.  MMG:  11/02/14 dense cat. B, bi-rads cat. 1 neg Colonoscopy:  n/a BMD:   n/a  Result  n/a TDaP:  10/30/2012 Screening Labs: PCP Hb today: PCP, Urine today:    reports that she has never smoked. She has never used smokeless tobacco. She reports that she drinks about 0.6 - 1.2 oz of alcohol per week. She reports that she does not use illicit drugs.  Past Medical History  Diagnosis Date  . Fibroid uterus   . Migraine headache without aura   . No pertinent past medical history   . STD (sexually transmitted disease)     HSV II  . AC (acromioclavicular) joint bone spurs     left foot    Past Surgical History  Procedure Laterality Date  . Foot surgery  2013    bone spurs removed  . Robotic assisted total hysterectomy  01/09/2012    Procedure: ROBOTIC ASSISTED TOTAL HYSTERECTOMY;  Surgeon: Azalia Bilis, MD;   Location: [facility];  Service: Gynecology;  Laterality: N/A;  . Bilateral salpingectomy  01/09/2012    Procedure: BILATERAL SALPINGECTOMY;  Surgeon: Azalia Bilis, MD;  Location: [facility];  Service: Gynecology;  Laterality: Bilateral;  . Cystoscopy  01/09/2012    Procedure: CYSTOSCOPY;  Surgeon: Azalia Bilis, MD;  Location: [facility];  Service: Gynecology;  Laterality: N/A;  . Fractured wrist Right 2008  . Removal of uterine fibroids      in pt's 20's   . Eye surgery      Current Outpatient Prescriptions  Medication Sig Dispense Refill  . cetirizine (ZYRTEC) 10 MG tablet Take 10 mg by mouth.    . doxycycline (MONODOX) 100 MG capsule   2  . Multiple Vitamin (MULTIVITAMIN WITH MINERALS) TABS Take 1 tablet by mouth daily.    . Phentermine-Topiramate (QSYMIA) 3.75-23 MG CP24 Take 1 capsule by mouth daily.    . valACYclovir (VALTREX) 1000 MG tablet Take 1 tablet (1,000 mg total) by mouth daily. 90 tablet 3  . Vitamin D, Ergocalciferol, (DRISDOL) 50000 UNITS CAPS capsule Take 500 Units by mouth daily.     No current facility-administered medications for this visit.    Family History  Problem Relation Age of Onset  . Adopted: Yes  . Asthma Mother   . Diabetes Mother  insulin dependent    ROS:  Pertinent items are noted in HPI.  Otherwise, a comprehensive ROS was negative.  Exam:   BP 112/70 mmHg  Pulse 70  Resp 14  Wt 242 lb (109.77 kg)  LMP 09/26/2011    General appearance: alert, cooperative and appears stated age Head: Normocephalic, without obvious abnormality, atraumatic Neck: no adenopathy, supple, symmetrical, trachea midline and thyroid normal to inspection and palpation Lungs: clear to auscultation bilaterally Breasts: normal appearance, no masses or tenderness Heart: regular rate and rhythm Abdomen: soft, non-tender; bowel sounds normal; no masses,  no organomegaly Extremities: extremities normal, atraumatic, no cyanosis or edema Skin: Skin color, texture, turgor  normal. No rashes or lesions Lymph nodes: Cervical, supraclavicular, and axillary nodes normal. No abnormal inguinal nodes palpated Neurologic: Grossly normal  Pelvic: External genitalia:  no lesions              Urethra:  normal appearing urethra with no masses, tenderness or lesions              Bartholins and Skenes: normal                 Vagina: normal appearing vagina with normal color and discharge, no lesions              Cervix: absent  Bimanual Exam:  Uterus:  uterus absent              Adnexa: normal adnexa              Rectovaginal: Yes.  .  Confirms.              Anus:  normal sphincter tone, no lesions  Chaperone was present for exam.  Assessment:   Well woman visit with normal exam. Screening STD H/O leep in her 20's  Vaginal d/c HSV, infrequent outbreaks   Plan: Yearly mammogram recommended after age 90. Just done Recommended self breast exam.  Pap and HR HPV as above. Discussed Calcium, Vitamin D, regular exercise program including cardiovascular and weight bearing exercise. STD testing Valtrex desires the 1000 mg dose, takes prn, aware of the recommendations   After visit summary provided.

## 2014-11-05 LAB — STD PANEL
HEP B S AG: NEGATIVE
HIV 1&2 Ab, 4th Generation: NONREACTIVE
RPR Ser Ql: REACTIVE — AB

## 2014-11-05 LAB — WET PREP BY MOLECULAR PROBE
CANDIDA SPECIES: NEGATIVE
Gardnerella vaginalis: POSITIVE — AB
Trichomonas vaginosis: NEGATIVE

## 2014-11-05 LAB — RPR TITER

## 2014-11-05 LAB — VITAMIN D 25 HYDROXY (VIT D DEFICIENCY, FRACTURES): VIT D 25 HYDROXY: 33 ng/mL (ref 30–100)

## 2014-11-05 LAB — HEPATITIS C ANTIBODY: HCV AB: NEGATIVE

## 2014-11-05 LAB — FLUORESCENT TREPONEMAL AB(FTA)-IGG-BLD: FLUORESCENT TREPONEMAL ABS: NONREACTIVE

## 2014-11-06 ENCOUNTER — Telehealth: Payer: Self-pay | Admitting: *Deleted

## 2014-11-06 LAB — IPS N GONORRHOEA AND CHLAMYDIA BY PCR

## 2014-11-06 MED ORDER — METRONIDAZOLE 500 MG PO TABS: 500.0000 mg | ORAL_TABLET | Freq: Two times a day (BID) | ORAL | Status: DC

## 2014-11-06 NOTE — Telephone Encounter (Signed)
Call to patient. Notified vaginal probe results positive for BV as directed By Dr Talbert Nan. Patient prefers oral Flagyl. Given instructions for no alcohol while on medication and for three days following. Voiced understanding. Advised of Vitamin D result and instruction to supplement with 800 IU daily. Advised GC/ CHL result negative. Syphilis and pap result still pending.  Patient requests RX to [facility] at [facility].  Routing to provider for final review. Patient agreeable to disposition. Will close encounter.

## 2014-11-06 NOTE — Telephone Encounter (Signed)
-----   Message from Salvadore Dom, MD sent at 11/06/2014 11:01 AM EDT ----- Please check on the RPR confirmatory test as per our discussion. Please inform the patient that her vaginitis probe was + for BV and treat with flagyl (either oral or vaginal, her choice), no ETOH while on Flagyl.  Oral: Flagyl 500 mg BID x 7 days, or Vaginal: Metrogel, 1 applicator per vagina q day x 5 days. Her vit D level was in the low normal range. I would recommend that she be on 800 IU a day of vit D. You can inform her of her negative results and let her know that her pap and syphilis test are pending.  Thanks!!

## 2014-11-10 ENCOUNTER — Telehealth: Payer: Self-pay | Admitting: *Deleted

## 2014-11-10 LAB — IPS PAP TEST WITH HPV

## 2014-11-10 NOTE — Telephone Encounter (Signed)
See result note-eh

## 2014-11-10 NOTE — Telephone Encounter (Signed)
Patient returning call.

## 2014-11-10 NOTE — Telephone Encounter (Signed)
Left message for patient to call in regards to lab results -eh

## 2014-11-13 ENCOUNTER — Telehealth: Payer: Self-pay | Admitting: Obstetrics and Gynecology

## 2014-11-13 NOTE — Telephone Encounter (Signed)
Message left to return call to Alexandra Posadas at [phone].    

## 2014-11-13 NOTE — Telephone Encounter (Signed)
Patient was treated by Dr. Talbert Nan for [place] 11/04/2014  and she says that she thinks she has a yeast infection she is asking for Diflucan to be called into [facility] in [place]. Best contact [phone]

## 2014-11-13 NOTE — Telephone Encounter (Addendum)
Patient returned call she states she is not currently experiencing any symptoms of yeast at this time but reports yeast after treatment with Flagyl in the past. She is calling to request a prescription be sent for diflucan in case it is needed. Advised can use Monistat 3 or 7 day treatment if necessary, however, patient states this is inneffective for her in the past.  Advised patient that I will send her request to Dr. Talbert Nan for review. Patient agreeable.

## 2014-11-17 NOTE — Telephone Encounter (Signed)
Please call back and check on the patient. She should be done with her Flagyl. I don't typically treat without symptoms.

## 2014-11-17 NOTE — Telephone Encounter (Signed)
Message left to return call to [name] at [phone].    

## 2015-02-22 DIAGNOSIS — G43009 Migraine without aura, not intractable, without status migrainosus: Secondary | ICD-10-CM | POA: Insufficient documentation

## 2015-03-08 ENCOUNTER — Institutional Professional Consult (permissible substitution): Payer: BLUE CROSS/BLUE SHIELD | Admitting: Internal Medicine

## 2015-03-18 ENCOUNTER — Other Ambulatory Visit: Payer: Self-pay | Admitting: Obstetrics and Gynecology

## 2015-03-19 ENCOUNTER — Ambulatory Visit (INDEPENDENT_AMBULATORY_CARE_PROVIDER_SITE_OTHER): Payer: BLUE CROSS/BLUE SHIELD | Admitting: Nurse Practitioner

## 2015-03-19 ENCOUNTER — Encounter: Payer: Self-pay | Admitting: Nurse Practitioner

## 2015-03-19 ENCOUNTER — Ambulatory Visit: Payer: BLUE CROSS/BLUE SHIELD | Admitting: Nurse Practitioner

## 2015-03-19 VITALS — BP 120/74 | HR 68 | Ht 66.75 in | Wt 230.0 lb

## 2015-03-19 DIAGNOSIS — N760 Acute vaginitis: Secondary | ICD-10-CM | POA: Diagnosis not present

## 2015-03-19 MED ORDER — METRONIDAZOLE 500 MG PO TABS: 500.0000 mg | ORAL_TABLET | Freq: Two times a day (BID) | ORAL | Status: DC

## 2015-03-19 NOTE — Patient Instructions (Signed)
Will call with test results

## 2015-03-19 NOTE — Progress Notes (Signed)
45 y.o. Single African American female [name] here with complaint of vaginal symptoms of itching, burning, and increase discharge. Describes discharge as white with odor. Onset of symptoms 7 days ago. Denies new personal products or vaginal dryness.  She uses the same non latex condoms without spermicide.  She was SA without use of condoms and that is when she noted the symptoms.   No STD concerns. Urinary symptoms none.  Adopted mother passed away March 18, 2015 age 15.  O:  Healthy female WDWN Affect: normal, orientation x 3  Exam: no acute distress Abdomen: soft and non tender Lymph node: no enlargement or tenderness Pelvic exam: External genital: normal female BUS: negative Vagina: white thin frothy discharge with very irritated side walls noted.  Affirm taken. Cervix: absent Uterus: absent Adnexa:normal, non tender, no masses or fullness noted  Affirm is done; looked at wet prep here and did not see Trich  A: Vaginitis  R/O trichomonas and or BV  R/O STD's   P: Discussed findings of vaginitis and etiology. Discussed Aveeno or baking soda sitz bath for comfort. Avoid moist clothes or pads for extended period of time. If working out in gym clothes or swim suits for long periods of time change underwear or bottoms of swimsuit if possible. Olive Oil/Coconut Oil use for skin protection prior to activity can be used to external skin.  Rx: Flagyl 500 mg BID # 14  Follow with Affirm  RV prn

## 2015-03-20 LAB — WET PREP BY MOLECULAR PROBE
Candida species: NEGATIVE
GARDNERELLA VAGINALIS: NEGATIVE
TRICHOMONAS VAG: POSITIVE — AB

## 2015-03-21 ENCOUNTER — Other Ambulatory Visit: Payer: Self-pay | Admitting: Nurse Practitioner

## 2015-03-21 NOTE — Progress Notes (Signed)
Encounter reviewed by Dr. Etana Beets Amundson C. Silva.  

## 2015-03-22 ENCOUNTER — Telehealth: Payer: Self-pay | Admitting: Emergency Medicine

## 2015-03-22 ENCOUNTER — Telehealth: Payer: Self-pay | Admitting: Nurse Practitioner

## 2015-03-22 MED ORDER — FLUCONAZOLE 150 MG PO TABS: ORAL_TABLET | ORAL | Status: DC

## 2015-03-22 NOTE — Telephone Encounter (Signed)
Patient Result Comments     Entered by Kem Boroughs, FNP at 03/21/2015 1:41 PM    Read by Zonia Kief at 03/22/2015 6:40 AM    Sharyn Lull, The results did not come in until late. But as suspected you do have a Trichomonas infection. You will need to take the Flagyl a little different in order to treat this. Dosage is 2 Gm or 4 tablets at one time. Your partner also needs to be treated and this is a sexually transmitted infection. The GC and Chlamydia is not yet back.

## 2015-03-22 NOTE — Telephone Encounter (Signed)
Message left to return call to Lille Karim at [phone].    

## 2015-03-22 NOTE — Telephone Encounter (Signed)
Telephone call for triage created to discuss message with patient and disposition as appropriate.   

## 2015-03-22 NOTE — Telephone Encounter (Signed)
Encounter closed. Orders obtained. See additional open phone note.

## 2015-03-22 NOTE — Telephone Encounter (Signed)
Patient is returning a call from this morning. No open telephone notes? Patient was last seen 03/19/15.

## 2015-03-22 NOTE — Telephone Encounter (Signed)
Routing message to Kem Boroughs, FNP

## 2015-03-22 NOTE — Telephone Encounter (Signed)
Chief Complaint  Patient presents with  . Advice Only    Patient sent mychart message     ===View-only below this line===   ----- Message -----    From: Josephson,Mirella    Sent: 03/19/2015  7:21 PM EST      To: ROLEN-GRUBB, PATRICIA, FNP Subject: Non-Urgent Medical Question  Good evening,   I totally forgot to mention that I will need a prescription called in to [facility] in [place]  for Diflucan-- the antibiotics will definitely give me a yeast infection..   Thank you Sharyn Lull

## 2015-03-23 LAB — IPS N GONORRHOEA AND CHLAMYDIA BY PCR

## 2015-03-23 NOTE — Telephone Encounter (Signed)
Returned call to patient to schedule 3 month test of cure.  Message left to return call to [place] at [phone].

## 2015-03-23 NOTE — Telephone Encounter (Signed)
Patient returned call. She states she understands the message via mychart from Kem Boroughs, [place] and had no additional questions.  She will use Flagyl and Diflucan as directed and will call back as needed.

## 2015-03-23 NOTE — Telephone Encounter (Signed)
Message left to return call to [place] at [phone].   Diflucan rx sent in via mychart message request after orders from Kem Boroughs, FNP received.

## 2015-03-26 ENCOUNTER — Encounter: Payer: Self-pay | Admitting: Nurse Practitioner

## 2015-03-26 NOTE — Telephone Encounter (Signed)
Mychart message sent to patient to request she call for 3 month test of cure.

## 2015-03-26 NOTE — Telephone Encounter (Signed)
Responded to patient via mychart.  Forward to Kem Boroughs, FNP for review upon return to office.

## 2015-04-01 NOTE — Telephone Encounter (Signed)
Responded to patient via mychart and requested that she call for an appointment.

## 2015-04-05 NOTE — Telephone Encounter (Signed)
[name] have you had a chance to contact patient if so I can close encounter?

## 2015-04-05 NOTE — Telephone Encounter (Signed)
Patient has had follow up appointment with Kem Boroughs, FNP.  Will close encounter.

## 2015-04-07 ENCOUNTER — Encounter: Payer: Self-pay | Admitting: Nurse Practitioner

## 2015-04-07 ENCOUNTER — Ambulatory Visit (INDEPENDENT_AMBULATORY_CARE_PROVIDER_SITE_OTHER): Payer: BLUE CROSS/BLUE SHIELD | Admitting: Nurse Practitioner

## 2015-04-07 VITALS — BP 114/70 | HR 68 | Resp 14 | Ht 66.75 in | Wt 230.0 lb

## 2015-04-07 DIAGNOSIS — N760 Acute vaginitis: Secondary | ICD-10-CM | POA: Diagnosis not present

## 2015-04-07 MED ORDER — TERCONAZOLE 0.4 % VA CREA: 1.0000 | TOPICAL_CREAM | Freq: Every day | VAGINAL | Status: DC

## 2015-04-07 NOTE — Progress Notes (Signed)
45 y.o. Single African American female [id number] here with complaint of vaginal symptoms of itching, burning, and increase discharge. Describes discharge as white.  They went on a [facility]day trip to [facility] for her birthday but had used condoms each time. Onset of symptoms within this past week. Denies new personal products or vaginal dryness. Same partner and does not think any STD concerns but wants testing. He was also suspected of having an infection and he saw his urologist yesterday.  He is awaiting test results and treatment.   Urinary symptoms none . Contraception is post hysterectomy 2013.   O:  Healthy female WDWN Affect: normal, orientation x 3  Exam: no distress Abdomen: soft and non tender Lymph node: no enlargement or tenderness Pelvic exam: External genital: normal female BUS: negative Vagina: white thick discharge noted.  Affirm taken Adnexa:normal, non tender, no masses or fullness noted    A: Vaginitis  R/O STD's  Most likely yeast vaginitis from recent Flagyl treatment   P: Discussed findings of vaginitis and etiology. Discussed Aveeno or baking soda sitz bath for comfort. Avoid moist clothes or pads for extended period of time. If working out in gym clothes or swim suits for long periods of time change underwear or bottoms of swimsuit if possible. Olive Oil/Coconut Oil use for skin protection prior to activity can be used to external skin.  Rx: will start on Terazol vaginal cream HS X 7  Follow with Affirm and STD's  RV prn

## 2015-04-07 NOTE — Progress Notes (Signed)
Encounter reviewed by Dr. Timon Geissinger Amundson C. Silva.  

## 2015-04-07 NOTE — Patient Instructions (Signed)
Will call with test results

## 2015-04-08 LAB — FLUORESCENT TREPONEMAL AB(FTA)-IGG-BLD: FLUORESCENT TREPONEMAL ABS: NONREACTIVE

## 2015-04-08 LAB — WET PREP BY MOLECULAR PROBE
Candida species: NEGATIVE
Gardnerella vaginalis: NEGATIVE
Trichomonas vaginosis: NEGATIVE

## 2015-04-09 LAB — STD PANEL
HIV 1&2 Ab, 4th Generation: NONREACTIVE
Hepatitis B Surface Ag: NEGATIVE
RPR: REACTIVE — AB

## 2015-04-09 LAB — RPR TITER

## 2015-04-12 ENCOUNTER — Telehealth: Payer: Self-pay

## 2015-04-12 LAB — IPS N GONORRHOEA AND CHLAMYDIA BY PCR

## 2015-04-12 NOTE — Telephone Encounter (Signed)
Left message to call Annia Gomm at [phone]. 

## 2015-04-12 NOTE — Telephone Encounter (Signed)
Spoke with patient. Results given as seen below from Kem Boroughs, [place]. She is agreeable and verbalizes understanding.  Routing to provider for final review. Patient agreeable to disposition. Will close encounter.

## 2015-04-12 NOTE — Telephone Encounter (Signed)
-----   Message from Kem Boroughs, [place] sent at 04/12/2015  9:01 AM EDT ----- Please inform pt that STD panel - HIV, Hep B, GC, chlamydia was negative.  The STS was reactive but confirmation test with a titer and treponemal test was non reactive.  The wet prep was negative.

## 2015-04-12 NOTE — Telephone Encounter (Signed)
Patient is returning a call to Kaitlyn. °

## 2015-08-06 HISTORY — PX: WISDOM TOOTH EXTRACTION: SHX21

## 2015-10-18 ENCOUNTER — Other Ambulatory Visit: Payer: Self-pay | Admitting: Nurse Practitioner

## 2015-10-18 DIAGNOSIS — Z1231 Encounter for screening mammogram for malignant neoplasm of breast: Secondary | ICD-10-CM

## 2015-11-09 ENCOUNTER — Encounter: Payer: Self-pay | Admitting: Nurse Practitioner

## 2015-11-09 ENCOUNTER — Ambulatory Visit (INDEPENDENT_AMBULATORY_CARE_PROVIDER_SITE_OTHER): Payer: BLUE CROSS/BLUE SHIELD | Admitting: Nurse Practitioner

## 2015-11-09 VITALS — BP 110/72 | HR 76 | Ht 67.0 in | Wt 243.0 lb

## 2015-11-09 DIAGNOSIS — Z113 Encounter for screening for infections with a predominantly sexual mode of transmission: Secondary | ICD-10-CM

## 2015-11-09 DIAGNOSIS — Z0000 Encounter for general adult medical examination without abnormal findings: Secondary | ICD-10-CM

## 2015-11-09 DIAGNOSIS — Z01419 Encounter for gynecological examination (general) (routine) without abnormal findings: Secondary | ICD-10-CM

## 2015-11-09 DIAGNOSIS — E559 Vitamin D deficiency, unspecified: Secondary | ICD-10-CM | POA: Diagnosis not present

## 2015-11-09 LAB — CBC
HEMATOCRIT: 40.2 % (ref 35.0–45.0)
Hemoglobin: 13.5 g/dL (ref 11.7–15.5)
MCH: 30.3 pg (ref 27.0–33.0)
MCHC: 33.6 g/dL (ref 32.0–36.0)
MCV: 90.1 fL (ref 80.0–100.0)
MPV: 9.8 fL (ref 7.5–12.5)
Platelets: 296 10*3/uL (ref 140–400)
RBC: 4.46 MIL/uL (ref 3.80–5.10)
RDW: 13.7 % (ref 11.0–15.0)
WBC: 7.7 10*3/uL (ref 3.8–10.8)

## 2015-11-09 LAB — TSH: TSH: 1.23 m[IU]/L

## 2015-11-09 MED ORDER — VALACYCLOVIR HCL 1 G PO TABS: 1000.0000 mg | ORAL_TABLET | Freq: Every day | ORAL | 3 refills | Status: DC

## 2015-11-09 NOTE — Patient Instructions (Signed)

## 2015-11-09 NOTE — Progress Notes (Signed)
Patient ID: [name], female   DOB: [birth year], 45 y.o.   MRN: [id number]  45 y.o. G2P0020 Single  African American Fe here for annual exam.  No vaso symptoms. Using condoms each time.  Same partner X 1 yr.  No new health problems.  Very few outbreaks of HSV.  Her adoptive Brother with kidney cancer is not doing well.  Last year found her birth mother.  She is ow seeking her birth father. Found 3 possible men that could be her father - 2 were negative DNA - the other one did not want testing.  She is going to try and pursue this.  The other 2 men were very gracious and kind to her.   Patient's last menstrual period was 09/26/2011.          Sexually active: Yes.    The current method of family planning is condoms most of the time.    Exercising: No.  The patient does not participate in regular exercise at present. Smoker:  no  Health Maintenance: Pap: 11/04/14, Negative with neg HR HPV MMG:  11/02/14, Bi-Rads 1: Negative, scheduled for 11/15/15 TDaP:  10/30/2012 HIV: 04/07/15 Labs: will get done today   reports that she has never smoked. She has never used smokeless tobacco. She reports that she drinks about 0.6 - 1.2 oz of alcohol per week . She reports that she does not use drugs.  Past Medical History:  Diagnosis Date  . AC (acromioclavicular) joint bone spurs    left foot  . Fibroid uterus   . Migraine headache without aura   . No pertinent past medical history   . STD (sexually transmitted disease)    HSV II    Past Surgical History:  Procedure Laterality Date  . BILATERAL SALPINGECTOMY  01/09/2012   Procedure: BILATERAL SALPINGECTOMY;  Surgeon: Azalia Bilis, MD;  Location: [facility];  Service: Gynecology;  Laterality: Bilateral;  . CYSTOSCOPY  01/09/2012   Procedure: CYSTOSCOPY;  Surgeon: Azalia Bilis, MD;  Location: [facility];  Service: Gynecology;  Laterality: N/A;  . EYE SURGERY    . FOOT SURGERY  2013   bone spurs removed  . fractured wrist Right 2008  . removal of  uterine fibroids     in pt's 20's   . ROBOTIC ASSISTED TOTAL HYSTERECTOMY  01/09/2012   Procedure: ROBOTIC ASSISTED TOTAL HYSTERECTOMY;  Surgeon: Azalia Bilis, MD;  Location: [facility];  Service: Gynecology;  Laterality: N/A;    Current Outpatient Prescriptions  Medication Sig Dispense Refill  . cetirizine (ZYRTEC) 10 MG tablet Take 10 mg by mouth.    . doxycycline (MONODOX) 100 MG capsule   2  . Liraglutide -Weight Management 18 MG/3ML SOPN Inject into the skin.    . Multiple Vitamin (MULTIVITAMIN WITH MINERALS) TABS Take 1 tablet by mouth daily.    Marland Kitchen QSYMIA 7.5-46 MG CP24 Take 1 capsule by mouth daily.  1  . rizatriptan (MAXALT-MLT) 10 MG disintegrating tablet Take 1 tablet by mouth as needed.  5  . terconazole (TERAZOL 7) 0.4 % vaginal cream Place 1 applicator vaginally at bedtime. One applicator full QHS for seven days of therapy 45 g 0  . valACYclovir (VALTREX) 1000 MG tablet Take 1 tablet (1,000 mg total) by mouth daily. 90 tablet 3  . Vitamin D, Ergocalciferol, (DRISDOL) 50000 UNITS CAPS capsule Take 500 Units by mouth daily.     No current facility-administered medications for this visit.     Family History  Problem  Relation Age of Onset  . Adopted: Yes  . Asthma Mother   . Diabetes Mother     insulin dependent    ROS:  Pertinent items are noted in HPI.  Otherwise, a comprehensive ROS was negative.  Exam:   LMP 09/26/2011    Ht Readings from Last 3 Encounters:  04/07/15 5' 6.75" (1.695 m)  03/19/15 5' 6.75" (1.695 m)  04/22/14 5' 6.75" (1.695 m)    General appearance: alert, cooperative and appears stated age Head: Normocephalic, without obvious abnormality, atraumatic Neck: no adenopathy, supple, symmetrical, trachea midline and thyroid normal to inspection and palpation Lungs: clear to auscultation bilaterally Breasts: normal appearance, no masses or tenderness Heart: regular rate and rhythm Abdomen: soft, non-tender; no masses,  no organomegaly Extremities:  extremities normal, atraumatic, no cyanosis or edema Skin: Skin color, texture, turgor normal. No rashes or lesions Lymph nodes: Cervical, supraclavicular, and axillary nodes normal. No abnormal inguinal nodes palpated Neurologic: Grossly normal   Pelvic: External genitalia:  no lesions              Urethra:  normal appearing urethra with no masses, tenderness or lesions              Bartholin's and Skene's: normal                 Vagina: normal appearing vagina with normal color and discharge, no lesions              Cervix: absent              Pap taken: No. Bimanual Exam:  Uterus:  uterus absent              Adnexa: no mass, fullness, tenderness               Rectovaginal: Confirms               Anus:  normal sphincter tone, no lesions  Chaperone present: yes  A:  Well Woman with normal exam  Screening STD  H/O leep in her 20's  HSV, infrequent outbreaks   P:   Reviewed health and wellness pertinent to exam  Pap smear as above  Mammogram is scheduled  Follow with labs  RF on Valtrex for a year  Counseled on breast self exam, mammography screening, adequate intake of calcium and vitamin D, diet and exercise return annually or prn  An After Visit Summary was printed and given to the patient.

## 2015-11-10 LAB — HEMOGLOBIN A1C
Hgb A1c MFr Bld: 5.4 % (ref <5.7)
MEAN PLASMA GLUCOSE: 108 mg/dL

## 2015-11-10 LAB — LIPID PANEL
CHOL/HDL RATIO: 2.8 ratio (ref <=5.0)
Cholesterol: 199 mg/dL (ref 125–200)
HDL: 72 mg/dL (ref >=46)
LDL CALC: 106 mg/dL (ref <130)
TRIGLYCERIDES: 105 mg/dL (ref <150)
VLDL: 21 mg/dL (ref <30)

## 2015-11-10 LAB — WET PREP BY MOLECULAR PROBE
CANDIDA SPECIES: NEGATIVE
Gardnerella vaginalis: NEGATIVE
TRICHOMONAS VAG: NEGATIVE

## 2015-11-10 LAB — COMPREHENSIVE METABOLIC PANEL
ALBUMIN: 4 g/dL (ref 3.6–5.1)
ALT: 15 U/L (ref 6–29)
AST: 13 U/L (ref 10–35)
Alkaline Phosphatase: 60 U/L (ref 33–115)
BUN: 15 mg/dL (ref 7–25)
CHLORIDE: 101 mmol/L (ref 98–110)
CO2: 26 mmol/L (ref 20–31)
Calcium: 9.4 mg/dL (ref 8.6–10.2)
Creat: 0.92 mg/dL (ref 0.50–1.10)
Glucose, Bld: 75 mg/dL (ref 65–99)
POTASSIUM: 4.1 mmol/L (ref 3.5–5.3)
Sodium: 137 mmol/L (ref 135–146)
TOTAL PROTEIN: 7.6 g/dL (ref 6.1–8.1)
Total Bilirubin: 0.2 mg/dL (ref 0.2–1.2)

## 2015-11-10 LAB — STD PANEL
HEP B S AG: NEGATIVE
HIV 1&2 Ab, 4th Generation: NONREACTIVE
RPR Ser Ql: REACTIVE — AB

## 2015-11-10 LAB — RPR TITER: RPR Titer: 1:2 {titer}

## 2015-11-10 LAB — VITAMIN D 25 HYDROXY (VIT D DEFICIENCY, FRACTURES): VIT D 25 HYDROXY: 36 ng/mL (ref 30–100)

## 2015-11-10 LAB — FLUORESCENT TREPONEMAL AB(FTA)-IGG-BLD: FLUORESCENT TREPONEMAL ABS: REACTIVE — AB

## 2015-11-12 ENCOUNTER — Telehealth: Payer: Self-pay

## 2015-11-12 NOTE — Telephone Encounter (Signed)
-----   Message from Nunzio Cobbs, MD sent at 11/11/2015  7:30 PM EDT ----- Please have patient come in to office to discuss her testing for syphilis.  Her testing is positive this year.  The result is different from in the past.  All other blood work (blood counts, cholesterol, vit D, hemoglobin 123XX123, metabolic profile, thyroid) and Affirm were negative.   After we have confirmation of a diagnosis, the health department will need to be informed.  Cc- Patty Thea Silversmith

## 2015-11-12 NOTE — Progress Notes (Signed)
Encounter reviewed by Dr. Ysabelle Goodroe Amundson C. Silva.  

## 2015-11-12 NOTE — Telephone Encounter (Signed)
Patient returning call.

## 2015-11-12 NOTE — Telephone Encounter (Signed)
Left message to call Kaleeyah Cuffie at [phone]. 

## 2015-11-15 ENCOUNTER — Ambulatory Visit
Admission: RE | Admit: 2015-11-15 | Discharge: 2015-11-15 | Disposition: A | Discharge status: Home / Self Care | Payer: BLUE CROSS/BLUE SHIELD | Source: Ambulatory Visit | Attending: Nurse Practitioner | Admitting: Nurse Practitioner

## 2015-11-15 DIAGNOSIS — Z1231 Encounter for screening mammogram for malignant neoplasm of breast: Secondary | ICD-10-CM

## 2015-11-15 NOTE — Telephone Encounter (Signed)
Spoke with patient. Advised patient of results and message as seen below from Lorenzo. Patient verbalizes understanding. Offered patient appointment today with Dr.Silva, but patient declines. Appointment scheduled for 11/17/2015 at 3:30 pm with Dr.Silva. Patient is agreeable to date and time.\  Cc: Kem Boroughs, FNP   Routing to provider for final review. Patient agreeable to disposition. Will close encounter.

## 2015-11-17 ENCOUNTER — Ambulatory Visit (INDEPENDENT_AMBULATORY_CARE_PROVIDER_SITE_OTHER): Payer: BLUE CROSS/BLUE SHIELD | Admitting: Obstetrics and Gynecology

## 2015-11-17 VITALS — BP 100/60 | HR 96 | Temp 98.2°F | Resp 14 | Ht 67.0 in | Wt 246.0 lb

## 2015-11-17 DIAGNOSIS — A539 Syphilis, unspecified: Secondary | ICD-10-CM

## 2015-11-17 NOTE — Progress Notes (Signed)
GYNECOLOGY  VISIT   HPI: 45 y.o.   Single  African American  female   [id number] with Patient's last menstrual period was 09/26/2011.   here for  Recheck RPR.  Seen for recent routine exam and had STD screening performed on 11/09/15. Had reactive RPR with titer 1:2 and reactive fluorescent Treponemal ABS.  All other STD testing negative.   No prior known syphilis infection.   No symptoms - no rash, ulcerative lesions, neurological changes.  Same partner for the last year and uses condoms.   Tested for STDs two other times in the last 12 months.  11/04/14 -  RPR reactive with titer 1:1.  Fluorescent treponemal ABS nonreactive.  04/07/15 - RPR reactive with titer 1:1.  Fluorescent treponemal ABS nonreactive.  Prior testing 08/26/13 - RPR nonreactive.   GYNECOLOGIC HISTORY: Patient's last menstrual period was 09/26/2011. Contraception:  Condoms  Menopausal hormone therapy:  None Last mammogram:  11/15/15 BIRADS1:neg  Last pap smear:   11/04/14 Neg. HR HPV:neg         OB History    Gravida Para Term Preterm AB Living   2 0 0 0 2 0   SAB TAB Ectopic Multiple Live Births   0 0 0 0 0         Patient Active Problem List   Diagnosis Date Noted  . Migraine without aura and responsive to treatment 02/22/2015  . Acne 08/04/2013  . Fatigue 08/04/2013  . Adult BMI 30+ 08/04/2013  . Rectal bleeding 04/29/2012  . 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 AC (acromioclavicular) joint bone spurs    left foot  . Fibroid uterus   . Migraine headache without aura   . No pertinent past medical history   . STD (sexually transmitted disease)    HSV II    Past Surgical History:  Procedure Laterality Date  . BILATERAL SALPINGECTOMY  01/09/2012   Procedure: BILATERAL SALPINGECTOMY;  Surgeon: Azalia Bilis, MD;  Location: [facility];  Service: Gynecology;  Laterality: Bilateral;  . CYSTOSCOPY  01/09/2012   Procedure: CYSTOSCOPY;  Surgeon:  Azalia Bilis, MD;  Location: [facility];  Service: Gynecology;  Laterality: N/A;  . EYE SURGERY    . FOOT SURGERY  2013   bone spurs removed  . fractured wrist Right 2008  . removal of uterine fibroids     in pt's 20's   . ROBOTIC ASSISTED TOTAL HYSTERECTOMY  01/09/2012   Procedure: ROBOTIC ASSISTED TOTAL HYSTERECTOMY;  Surgeon: Azalia Bilis, MD;  Location: [facility];  Service: Gynecology;  Laterality: N/A;  . WISDOM TOOTH EXTRACTION  08/06/2015    Current Outpatient Prescriptions  Medication Sig Dispense Refill  . cetirizine (ZYRTEC) 10 MG tablet Take 10 mg by mouth.    . Multiple Vitamin (MULTIVITAMIN WITH MINERALS) TABS Take 1 tablet by mouth daily.    . rizatriptan (MAXALT-MLT) 10 MG disintegrating tablet Take 1 tablet by mouth as needed.  5  . valACYclovir (VALTREX) 1000 MG tablet Take 1 tablet (1,000 mg total) by mouth daily. 90 tablet 3   No current facility-administered medications for this visit.      ALLERGIES: Latex  Family History  Problem Relation Age of Onset  . Adopted: Yes  . Asthma Mother   . Diabetes Mother     insulin dependent    Social History   Social History  . Marital status: Single    Spouse name: N/A  .  Number of children: N/A  . Years of education: N/A   Occupational History  . Not on file.   Social History Main Topics  . Smoking status: Never Smoker  . Smokeless tobacco: Never Used  . Alcohol use 0.6 - 1.2 oz/week    1 - 2 Glasses of wine per week  . Drug use: No  . Sexual activity: Yes    Birth control/ protection: Condom     Comment: R-TLH 12/2011   Other Topics Concern  . Not on file   Social History Narrative  . No narrative on file    ROS:  Pertinent items are noted in HPI.  PHYSICAL EXAMINATION:    BP 100/60 (BP Location: Right Arm, Patient Position: Sitting, Cuff Size: Large)   Pulse 96   Temp 98.2 F (36.8 C) (Oral)   Resp 14   Ht 5\' 7"  (1.702 m)   Wt 246 lb (111.6 kg)   LMP 09/26/2011   BMI 38.53 kg/m      General appearance: alert, cooperative and appears stated age.  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   PLAN  Discussion with patient regarding syphilis diagnosis.  She has already done her research and read about this also. I am referring her to the health department for PCN treatment and follow up.  Her partner will accompany her to the visit.  Our office will report this officially to health department.    An After Visit Summary was printed and given to the patient.  ___25___ minutes face to face time of which over 50% was spent in counseling.

## 2015-11-17 NOTE — Progress Notes (Signed)
Scheduled patient while in office for appointment with the [facility] in [place] for evaluation and treatment on 11/23/2015 at 10:30 am. Patient is agreeable to date and time. Address and telephone number provided to patient as seen below.  [place], [place] [phone]

## 2015-11-19 ENCOUNTER — Encounter: Payer: Self-pay | Admitting: Obstetrics and Gynecology

## 2015-12-13 ENCOUNTER — Encounter: Payer: Self-pay | Admitting: Nurse Practitioner

## 2015-12-15 ENCOUNTER — Telehealth: Payer: Self-pay | Admitting: *Deleted

## 2015-12-15 DIAGNOSIS — K921 Melena: Secondary | ICD-10-CM

## 2015-12-15 DIAGNOSIS — K649 Unspecified hemorrhoids: Secondary | ICD-10-CM

## 2015-12-15 NOTE — Telephone Encounter (Signed)
Spoke with patient in regards to MyChart message as seen below. Patient states she talked with Kem Boroughs, NP about blood in stool at last AEX 11/09/15 and was suppossed to be referred to Dr. Collene Mares. Patient states she forgot to ask and f/u at last OV 10/18. RN inquired about symptoms/complaints -patient states blood in stool for a couple of months, sometimes blood is dark, sometimes bright. Patient reports she has hemorrhoids and after speaking with Kem Boroughs, NP unable to determine if bleeding coming from there or not. Advised patient Kem Boroughs, NP out of the office until 12/20/15, will need to review with covering provider and return call. Patient is agreeable.   [name], CNM please advise on referral?  Cc: Kem Boroughs, NP

## 2015-12-15 NOTE — Telephone Encounter (Signed)
I think she needs an appointment with Dr. Collene Mares for evaluation if occurring that long.

## 2015-12-15 NOTE — Telephone Encounter (Signed)
Left message to call Sharee Pimple at [phone].   Zonia Kief To Kem Boroughs, FNP Sent 12/13/2015 6:48 PM  Hello,   During my last annual visit I was suppose to be referred to a physician for blood in stool. Still waiting for information or call from physician.   Thanks  [employer]

## 2015-12-16 NOTE — Telephone Encounter (Signed)
Left detailed message -ok per current dpr. Advised referral placed for Dr. Collene Mares -our referral dept will f/u with scheduling appt. Return call to office at [phone] for any additional questions.   Referral placed for Dr. Collene Mares for evaluation of hemorrhoids and blood in stool.  Cc: Kem Boroughs, NP; Theresia Lo   Routing to provider for final review. Patient is agreeable to disposition. Will close encounter.

## 2015-12-17 ENCOUNTER — Emergency Department (HOSPITAL_BASED_OUTPATIENT_CLINIC_OR_DEPARTMENT_OTHER): Payer: BLUE CROSS/BLUE SHIELD

## 2015-12-17 ENCOUNTER — Encounter (HOSPITAL_BASED_OUTPATIENT_CLINIC_OR_DEPARTMENT_OTHER): Payer: Self-pay | Admitting: *Deleted

## 2015-12-17 ENCOUNTER — Emergency Department (HOSPITAL_BASED_OUTPATIENT_CLINIC_OR_DEPARTMENT_OTHER)
Admission: EM | Admit: 2015-12-17 | Discharge: 2015-12-17 | Disposition: A | Discharge status: Home / Self Care | Payer: BLUE CROSS/BLUE SHIELD | Attending: Emergency Medicine | Admitting: Emergency Medicine

## 2015-12-17 DIAGNOSIS — Z79899 Other long term (current) drug therapy: Secondary | ICD-10-CM | POA: Diagnosis not present

## 2015-12-17 DIAGNOSIS — R109 Unspecified abdominal pain: Secondary | ICD-10-CM

## 2015-12-17 DIAGNOSIS — Z90710 Acquired absence of both cervix and uterus: Secondary | ICD-10-CM | POA: Diagnosis not present

## 2015-12-17 DIAGNOSIS — R1033 Periumbilical pain: Secondary | ICD-10-CM | POA: Diagnosis not present

## 2015-12-17 DIAGNOSIS — Z91040 Latex allergy status: Secondary | ICD-10-CM | POA: Insufficient documentation

## 2015-12-17 LAB — CBC WITH DIFFERENTIAL/PLATELET
Basophils Absolute: 0 10*3/uL (ref 0.0–0.1)
Basophils Relative: 0 %
Eosinophils Absolute: 0.2 10*3/uL (ref 0.0–0.7)
Eosinophils Relative: 3 %
HCT: 36.6 % (ref 36.0–46.0)
Hemoglobin: 12 g/dL (ref 12.0–15.0)
Lymphocytes Relative: 39 %
Lymphs Abs: 2.5 10*3/uL (ref 0.7–4.0)
MCH: 30.1 pg (ref 26.0–34.0)
MCHC: 32.8 g/dL (ref 30.0–36.0)
MCV: 91.7 fL (ref 78.0–100.0)
Monocytes Absolute: 0.6 10*3/uL (ref 0.1–1.0)
Monocytes Relative: 10 %
Neutro Abs: 3.1 10*3/uL (ref 1.7–7.7)
Neutrophils Relative %: 48 %
Platelets: 230 10*3/uL (ref 150–400)
RBC: 3.99 MIL/uL (ref 3.87–5.11)
RDW: 13 % (ref 11.5–15.5)
WBC: 6.4 10*3/uL (ref 4.0–10.5)

## 2015-12-17 LAB — URINALYSIS, ROUTINE W REFLEX MICROSCOPIC
Bilirubin Urine: NEGATIVE
GLUCOSE, UA: NEGATIVE mg/dL
HGB URINE DIPSTICK: NEGATIVE
Ketones, ur: NEGATIVE mg/dL
LEUKOCYTES UA: NEGATIVE
Nitrite: NEGATIVE
PROTEIN: NEGATIVE mg/dL
SPECIFIC GRAVITY, URINE: 1.014 (ref 1.005–1.030)
pH: 7 (ref 5.0–8.0)

## 2015-12-17 LAB — COMPREHENSIVE METABOLIC PANEL
ALT: 21 U/L (ref 14–54)
AST: 17 U/L (ref 15–41)
Albumin: 3.5 g/dL (ref 3.5–5.0)
Alkaline Phosphatase: 59 U/L (ref 38–126)
Anion gap: 7 (ref 5–15)
BUN: 12 mg/dL (ref 6–20)
CO2: 26 mmol/L (ref 22–32)
Calcium: 9.1 mg/dL (ref 8.9–10.3)
Chloride: 103 mmol/L (ref 101–111)
Creatinine, Ser: 0.75 mg/dL (ref 0.44–1.00)
GFR calc Af Amer: >60 mL/min (ref >60)
GFR calc non Af Amer: >60 mL/min (ref >60)
Glucose, Bld: 89 mg/dL (ref 65–99)
Potassium: 3.9 mmol/L (ref 3.5–5.1)
Sodium: 136 mmol/L (ref 135–145)
Total Bilirubin: 0.2 mg/dL — ABNORMAL LOW (ref 0.3–1.2)
Total Protein: 7 g/dL (ref 6.5–8.1)

## 2015-12-17 LAB — LIPASE, BLOOD: Lipase: 34 U/L (ref 11–51)

## 2015-12-17 MED ORDER — IOPAMIDOL (ISOVUE-300) INJECTION 61%
100.0000 mL | Freq: Once | INTRAVENOUS | Status: AC | PRN
  Administered 2015-12-17: 100 mL via INTRAVENOUS

## 2015-12-17 NOTE — ED Notes (Signed)
Patient transported to CT 

## 2015-12-17 NOTE — ED Notes (Signed)
Pt given d/c instructions as per chart. Verbalizes understanding. No questions. 

## 2015-12-17 NOTE — ED Notes (Signed)
ED Provider at bedside. 

## 2015-12-17 NOTE — ED Triage Notes (Signed)
Abdominal pain for a month. States she has a hard knot in her right groin. She had blood in her stool in October. Her stools have been hard.

## 2015-12-17 NOTE — ED Provider Notes (Signed)
[facility] DEPT [facility] Provider Note   CSN: [id number] Arrival date & time: 12/17/15  1205     History   Chief Complaint Chief Complaint  Patient presents with  . Abdominal Pain    HPI [name] is a 45 y.o. female.  HPI   45 year old female presents today with complaints of abdominal pain. Patient reports achy abdominal pain for the last month. She notes this is located in her mid abdomen, with no radiation of symptoms. She notes that she has had decreased appetite and early satiety. She notes history of constipation, normal caliber stools, but has had red blood streaking in her stools. She denies any nausea or vomiting, but notes indigestion. She denies any significant weight loss fever upper abdominal pain, vaginal bleeding or discharge. Patient reports approximately 3 years ago she had a total hysterectomy. Patient denies any other concerning signs or symptoms.  Past Medical History:  Diagnosis Date  . AC (acromioclavicular) joint bone spurs    left foot  . Fibroid uterus   . Migraine headache without aura   . No pertinent past medical history   . STD (sexually transmitted disease)    HSV II    Patient Active Problem List   Diagnosis Date Noted  . Migraine without aura and responsive to treatment 02/22/2015  . Acne 08/04/2013  . Fatigue 08/04/2013  . Adult BMI 30+ 08/04/2013  . Rectal bleeding 04/29/2012  . Granulation tissue at vaginal vault 04/29/2012    Past Surgical History:  Procedure Laterality Date  . BILATERAL SALPINGECTOMY  01/09/2012   Procedure: BILATERAL SALPINGECTOMY;  Surgeon: Azalia Bilis, MD;  Location: [facility];  Service: Gynecology;  Laterality: Bilateral;  . CYSTOSCOPY  01/09/2012   Procedure: CYSTOSCOPY;  Surgeon: Azalia Bilis, MD;  Location: [facility];  Service: Gynecology;  Laterality: N/A;  . EYE SURGERY    . FOOT SURGERY  2013   bone spurs removed  . fractured wrist Right 2008  . removal of uterine fibroids     in pt's 20's     . ROBOTIC ASSISTED TOTAL HYSTERECTOMY  01/09/2012   Procedure: ROBOTIC ASSISTED TOTAL HYSTERECTOMY;  Surgeon: Azalia Bilis, MD;  Location: [facility];  Service: Gynecology;  Laterality: N/A;  . WISDOM TOOTH EXTRACTION  08/06/2015    OB History    Gravida Para Term Preterm AB Living   2 0 0 0 2 0   SAB TAB Ectopic Multiple Live Births   0 0 0 0 0       Home Medications    Prior to Admission medications   Medication Sig Start Date End Date Taking? Authorizing Provider  cetirizine (ZYRTEC) 10 MG tablet Take 10 mg by mouth.   Yes Historical Provider, MD  Multiple Vitamin (MULTIVITAMIN WITH MINERALS) TABS Take 1 tablet by mouth daily.   Yes Historical Provider, MD  rizatriptan (MAXALT-MLT) 10 MG disintegrating tablet Take 1 tablet by mouth as needed. 02/22/15  Yes Historical Provider, MD  valACYclovir (VALTREX) 1000 MG tablet Take 1 tablet (1,000 mg total) by mouth daily. 11/09/15  Yes Kem Boroughs, FNP    Family History Family History  Problem Relation Age of Onset  . Adopted: Yes  . Asthma Mother   . Diabetes Mother     insulin dependent    Social History Social History  Substance Use Topics  . Smoking status: Never Smoker  . Smokeless tobacco: Never Used  . Alcohol use 0.6 - 1.2 oz/week    1 - 2 Glasses  of wine per week     Allergies   Latex   Review of Systems Review of Systems  All other systems reviewed and are negative.    Physical Exam Updated Vital Signs BP 105/63 (BP Location: Left Arm)   Pulse 70   Temp 98.5 F (36.9 C) (Oral)   Resp 20   Ht 5\' 7"  (1.702 m)   Wt 111.1 kg   LMP 09/26/2011   SpO2 100%   BMI 38.37 kg/m   Physical Exam  Constitutional: She is oriented to person, place, and time. She appears well-developed and well-nourished.  HENT:  Head: Normocephalic and atraumatic.  Eyes: Conjunctivae are normal. Pupils are equal, round, and reactive to light. Right eye exhibits no discharge. Left eye exhibits no discharge. No scleral  icterus.  Neck: Normal range of motion. No JVD present. No tracheal deviation present.  Pulmonary/Chest: Effort normal. No stridor.  Abdominal:  Tenderness to palpation of the periumbilical region extending down into the mid lower abdomen bilateral, no tenderness to palpation of the upper abdomen, no CVA tenderness, no obvious signs of hernia  Neurological: She is alert and oriented to person, place, and time. Coordination normal.  Psychiatric: She has a normal mood and affect. Her behavior is normal. Judgment and thought content normal.  Nursing note and vitals reviewed.    ED Treatments / Results  Labs (all labs ordered are listed, but only abnormal results are displayed) Labs Reviewed  COMPREHENSIVE METABOLIC PANEL - Abnormal; Notable for the following:       Result Value   Total Bilirubin 0.2 (*)    All other components within normal limits  URINALYSIS, ROUTINE W REFLEX MICROSCOPIC (NOT AT [facility])  CBC WITH DIFFERENTIAL/PLATELET  LIPASE, BLOOD    EKG  EKG Interpretation None       Radiology Ct Abdomen Pelvis W Contrast  Result Date: 12/17/2015 CLINICAL DATA:  Abdominal pain for 1 month. Constipation. Bloody stools. EXAM: CT ABDOMEN AND PELVIS WITH CONTRAST TECHNIQUE: Multidetector CT imaging of the abdomen and pelvis was performed using the standard protocol following bolus administration of intravenous contrast. CONTRAST:  155mL ISOVUE-300 IOPAMIDOL (ISOVUE-300) INJECTION 61% COMPARISON:  04/10/2014. FINDINGS: Lower chest: Clear lung bases.  Heart normal size. Hepatobiliary: 7 mm low-density lesion at the dome of the left lobe consistent with a cyst, stable from the prior CT. No other liver masses or lesions. Normal gallbladder. No bile duct dilation. Pancreas: Unremarkable. No pancreatic ductal dilatation or surrounding inflammatory changes. Spleen: Normal in size without focal abnormality. Adrenals/Urinary Tract: No adrenal masses. Small low-density lesion measuring 7 mm at  the midpole the left kidney, stable from the prior study, consistent with a cyst. No other liver masses or lesions, no stones and no hydronephrosis. Normal ureters. Normal bladder. Stomach/Bowel: Stomach is within normal limits. Appendix appears normal. No evidence of bowel wall thickening, distention, or inflammatory changes. There are few diverticula along the sigmoid colon. No increased colonic stool. Vascular/Lymphatic: Minimal aortic atherosclerotic calcifications. No aneurysm. No adenopathy. Reproductive: Status post hysterectomy. No adnexal masses. Other: Small fat containing umbilical hernia, stable.  No ascites. Musculoskeletal: No acute or significant osseous findings. IMPRESSION: 1. No acute findings. No findings to account for the patient's abdominal pain. No bowel inflammation and no significant increased stool. Electronically Signed   By: Lajean Manes M.D.   On: 12/17/2015 13:58    Procedures Procedures (including critical care time)  Medications Ordered in ED Medications  iopamidol (ISOVUE-300) 61 % injection 100 mL (100 mLs Intravenous  Contrast Given 12/17/15 1330)     Initial Impression / Assessment and Plan / ED Course  I have reviewed the triage vital signs and the nursing notes.  Pertinent labs & imaging results that were available during my care of the patient were reviewed by me and considered in my medical decision making (see chart for details).  Clinical Course      Final Clinical Impressions(s) / ED Diagnoses   Final diagnoses:  Abdominal pain, unspecified abdominal location    Labs:  Imaging:  Consults:  Therapeutics:  Discharge Meds:   Assessment/Plan:45 year old female presents today with abdominal pain. This is generalized nonfocal. Due to the duration of symptoms CT scan was ordered. No significant findings on CT scan, reassuring laboratory analysis, reassuring vital signs. Uncertain etiology of patient's abdominal pain today. She'll before to  gastroenterology, she is given strict return precautions, she verbalized understanding and agreement to today's plan had no further questions or concerns      New Prescriptions Discharge Medication List as of 12/17/2015  3:08 PM       Okey Regal, PA-C 12/17/15 [place], MD 12/19/15 [phone]

## 2015-12-17 NOTE — Discharge Instructions (Signed)
Please read attached information. If you experience any new or worsening signs or symptoms please return to the emergency room for evaluation. Please follow-up with your primary care provider or specialist as discussed. Please use medication prescribed only as directed and discontinue taking if you have any concerning signs or symptoms.   °

## 2015-12-31 LAB — HM COLONOSCOPY

## 2016-01-02 ENCOUNTER — Other Ambulatory Visit: Payer: Self-pay | Admitting: Ophthalmology

## 2016-02-08 ENCOUNTER — Telehealth: Payer: Self-pay | Admitting: Internal Medicine

## 2016-02-08 NOTE — Telephone Encounter (Signed)
Received records from Dr. Lorie Apley office and placed on Dr. Vena Rua desk for review.  She is requesting to switch from Dr. Lorie Apley office because she experienced bad service and does not want to return.  She saw [facility] ED and they requested she see Dr. Hilarie Fredrickson.

## 2016-02-29 NOTE — Telephone Encounter (Signed)
Per Dr Hilarie Fredrickson, he is "unable to accept" at present. I have returned patient records to [facility] staff.

## 2016-08-16 ENCOUNTER — Telehealth: Payer: Self-pay | Admitting: Obstetrics and Gynecology

## 2016-08-16 NOTE — Telephone Encounter (Signed)
Left message regarding upcoming appointment has been canceled and needs to be rescheduled. °

## 2016-10-03 IMAGING — CT CT ABD-PELV W/ CM
2 of 5 series · 16 of 46 positions shown, 18 images · IV contrast (APPLIED)
Comparison: None.

CLINICAL DATA: Acute onset lateral abdominal pain 24 hours ago.
Pain is worse with eating, standing, ambulation or any
motion.Headache.

EXAM:
CT ABDOMEN AND PELVIS WITH CONTRAST
TECHNIQUE: Multidetector CT imaging of the abdomen and pelvis was performed
using the standard protocol following bolus administration of
intravenous contrast.
CONTRAST:  50mL OMNIPAQUE IOHEXOL 300 MG/ML SOLN, 100mL OMNIPAQUE
IOHEXOL 300 MG/ML SOLN

[Series 2: abd/pelvis 5.0 b31f · axial · 0.70mm/px · z∈[+708,+1158]mm · 13 of 102 slices shown, 15 images]
[im 6/102  soft-tissue]
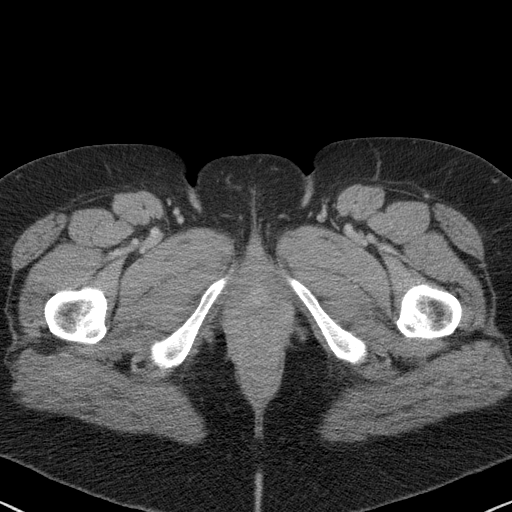
[im 6/102  bone]
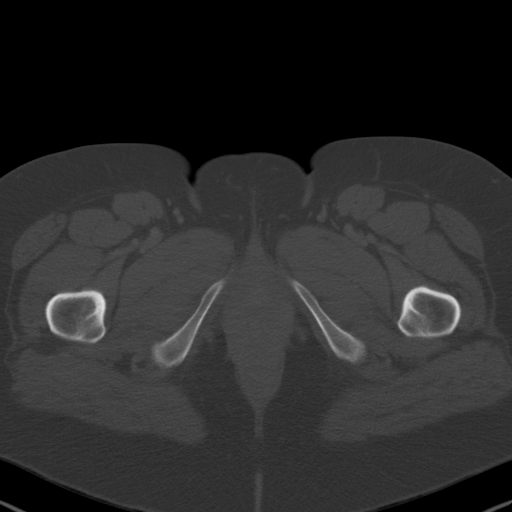
[im 16/102  soft-tissue]
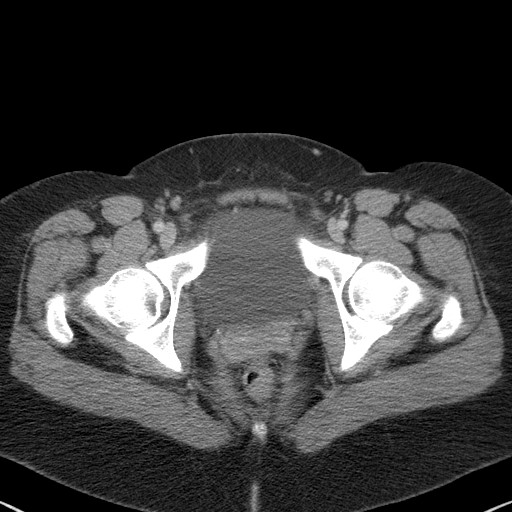
[im 22/102  soft-tissue]
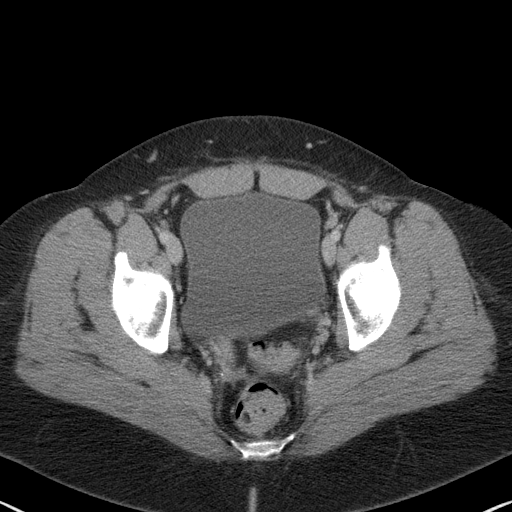
[im 27/102  soft-tissue]
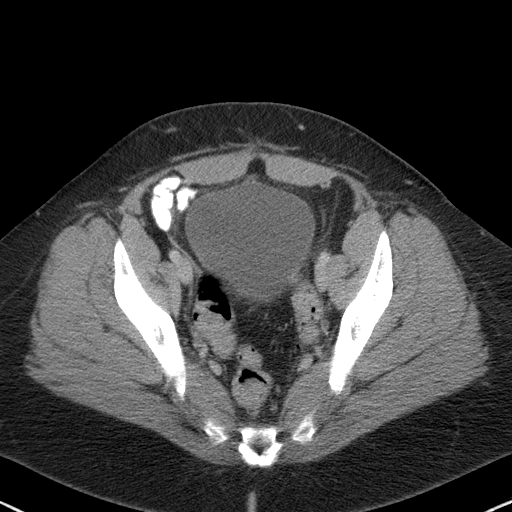
[im 38/102  soft-tissue]
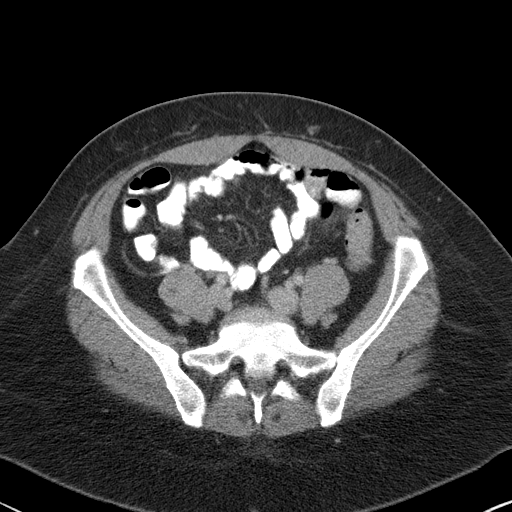
[im 43/102  soft-tissue]
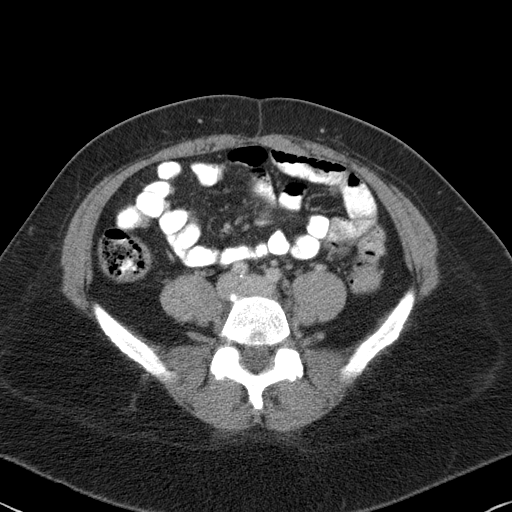
[im 54/102  soft-tissue]
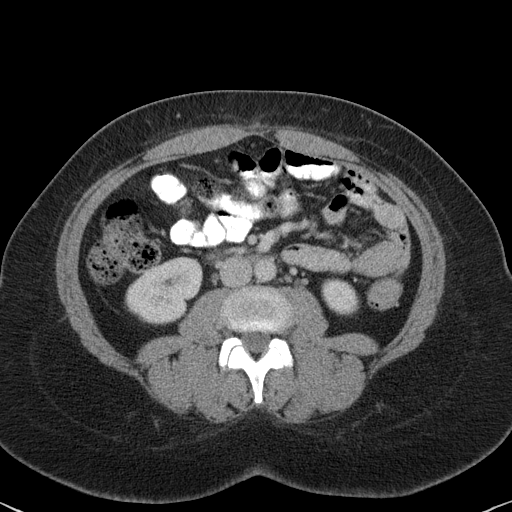
[im 59/102  soft-tissue]
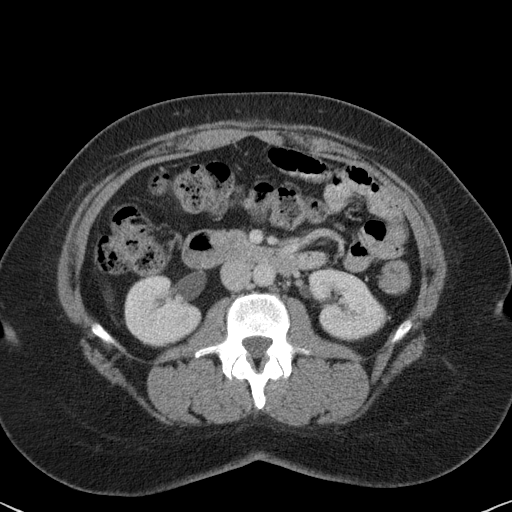
[im 64/102  soft-tissue]
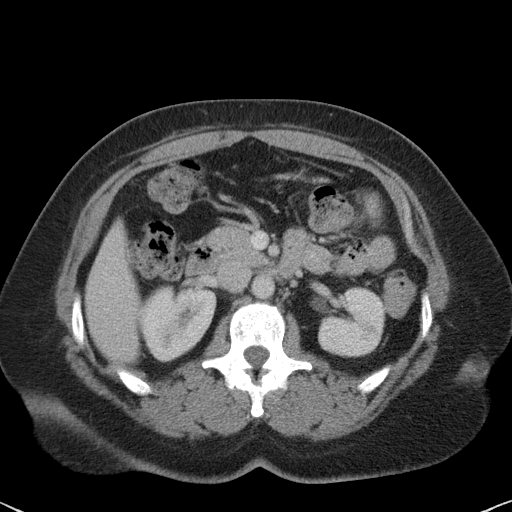
[im 64/102  bone]
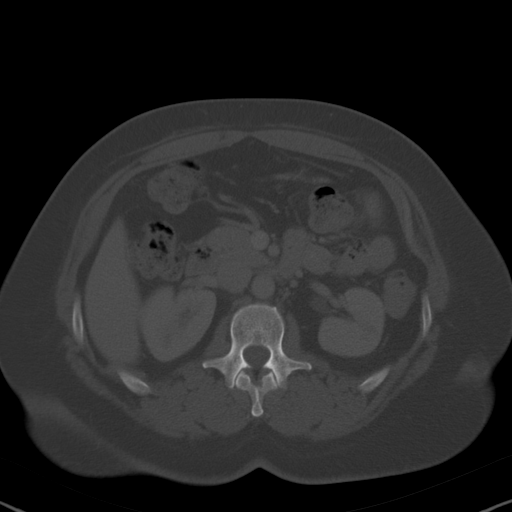
[im 75/102  soft-tissue]
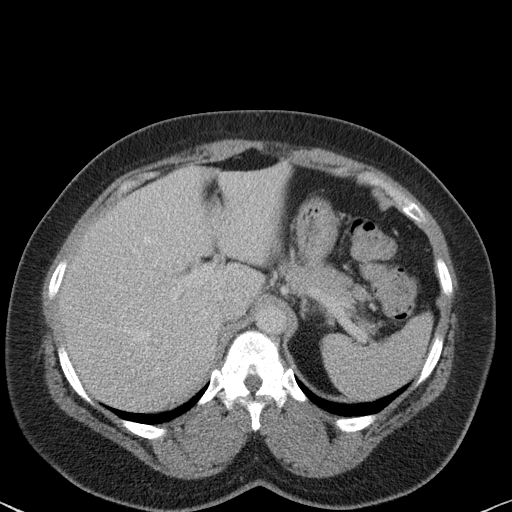
[im 80/102  soft-tissue]
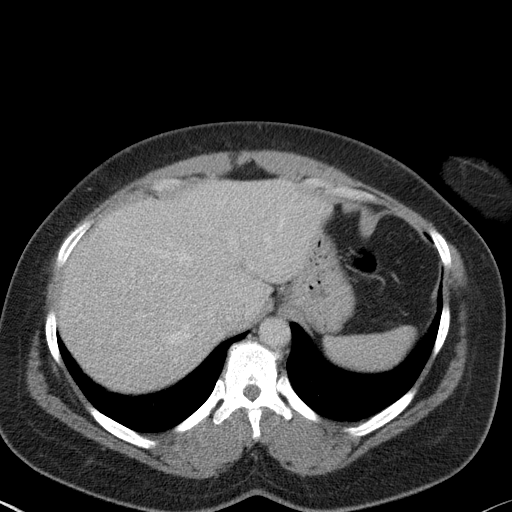
[im 86/102  soft-tissue]
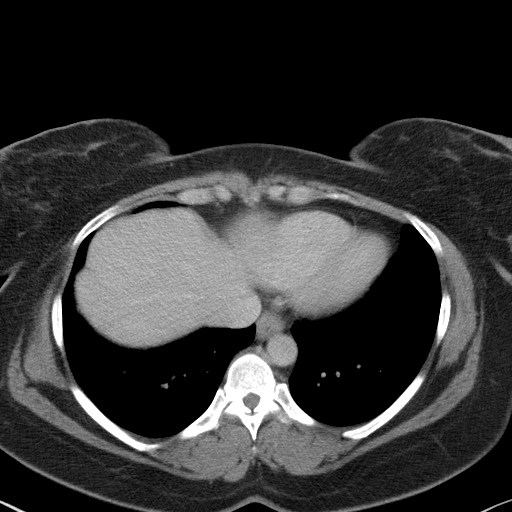
[im 96/102  soft-tissue]
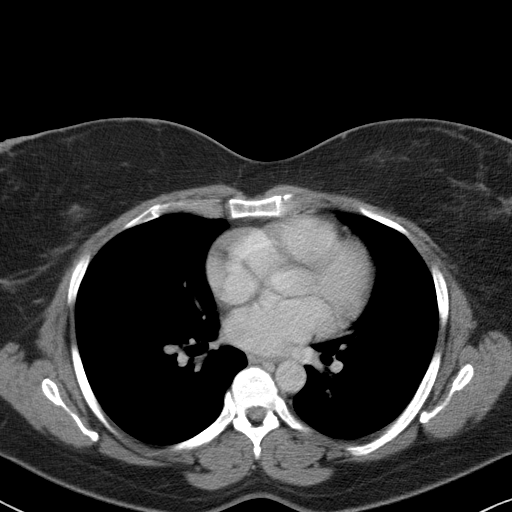

[Series 5: abd/pelvis 3.0 coronal · coronal · 0.73mm/px · 3 of 99 slices shown]
[im 33/99  soft-tissue]
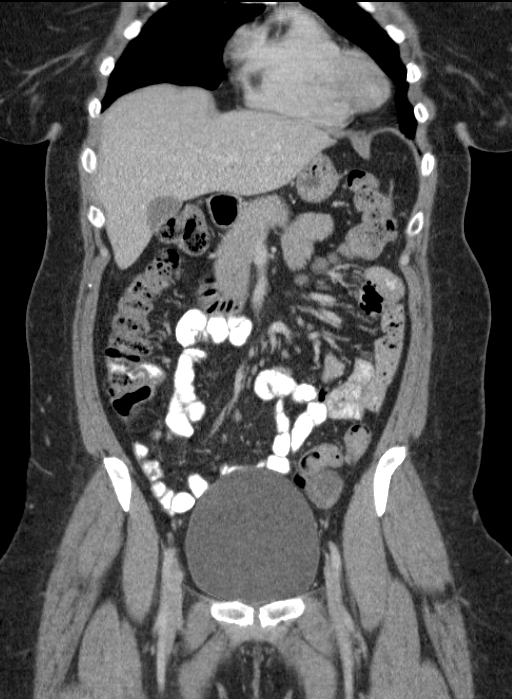
[im 44/99  soft-tissue]
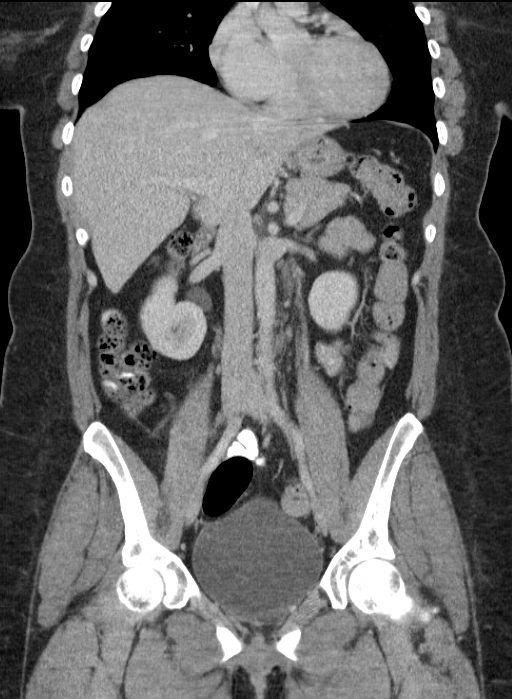
[im 55/99  soft-tissue]
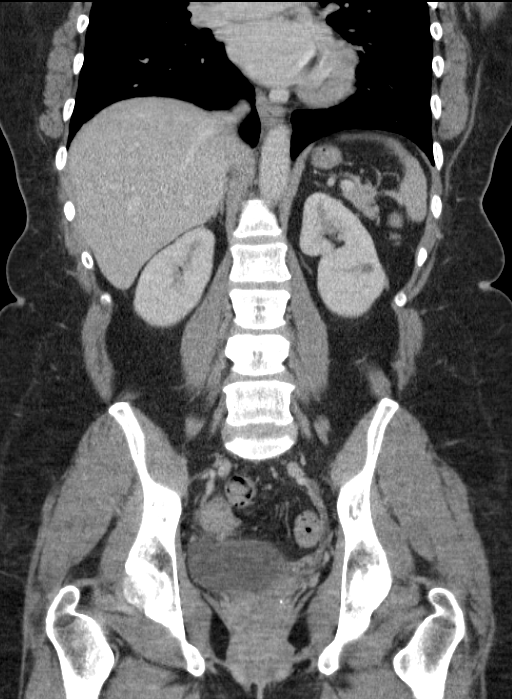

[16 of 46 positions shown; findings below may reference images not displayed]

FINDINGS: LUNG BASES: Included view of the lung bases are clear. Visualized
heart and pericardium are unremarkable.

SOLID ORGANS: The spleen, gallbladder, pancreas and adrenal glands
are unremarkable. Sub cm cyst in dome of the liver, liver is
otherwise unremarkable.

GASTROINTESTINAL TRACT: Small hiatal hernia. The stomach, small and
large bowel are normal in course and caliber without inflammatory
changes. Normal appendix.

KIDNEYS/ URINARY TRACT: Kidneys are orthotopic, demonstrating
symmetric enhancement. No nephrolithiasis, hydronephrosis or solid
renal masses. 11 mm LEFT interpolar cyst. The unopacified ureters
are normal in course and caliber. Delayed imaging through the
kidneys demonstrates symmetric prompt contrast excretion within the
proximal urinary collecting system. Urinary bladder is partially
distended and unremarkable.

PERITONEUM/RETROPERITONEUM: Aortoiliac vessels are normal in course
and caliber, minimal calcific atherosclerosis. No lymphadenopathy by
CT size criteria. Status post hysterectomy. 3.7 cm LEFT adnexal
hypodensity (27 Hounsfield units). No intraperitoneal free fluid nor
free air.

SOFT TISSUE/OSSEOUS STRUCTURES: Non-suspicious. Small fat containing
umbilical hernia. A tear abdominal wall scarring.
IMPRESSION: 3.7 cm LEFT adnexal low-density lesion (may reflect a
complex/hemorrhagic cyst). As patient reports LEFT lower quadrant
pain, consider further characterization with pelvic ultrasound.

  By: Joritz Barki

## 2016-11-10 ENCOUNTER — Ambulatory Visit: Payer: BLUE CROSS/BLUE SHIELD | Admitting: Nurse Practitioner

## 2016-11-13 ENCOUNTER — Telehealth: Payer: Self-pay | Admitting: *Deleted

## 2016-11-13 MED ORDER — VALACYCLOVIR HCL 1 G PO TABS: 1000.0000 mg | ORAL_TABLET | Freq: Every day | ORAL | 0 refills | Status: DC

## 2016-11-13 NOTE — Telephone Encounter (Signed)
Medication refill request: Valtrex  Last AEX:  11-09-15 Next AEX: 11-14-16  Last MMG (if hormonal medication request): 11-15-15 WNL  Refill authorized: please advise

## 2016-11-13 NOTE — Telephone Encounter (Signed)
3 month supply sent

## 2016-11-14 ENCOUNTER — Ambulatory Visit (INDEPENDENT_AMBULATORY_CARE_PROVIDER_SITE_OTHER): Payer: 59 | Admitting: Obstetrics and Gynecology

## 2016-11-14 ENCOUNTER — Encounter: Payer: Self-pay | Admitting: Obstetrics and Gynecology

## 2016-11-14 VITALS — BP 124/80 | HR 64 | Resp 16 | Ht 66.5 in | Wt 237.0 lb

## 2016-11-14 DIAGNOSIS — K921 Melena: Secondary | ICD-10-CM

## 2016-11-14 DIAGNOSIS — R6889 Other general symptoms and signs: Secondary | ICD-10-CM

## 2016-11-14 DIAGNOSIS — Z01419 Encounter for gynecological examination (general) (routine) without abnormal findings: Secondary | ICD-10-CM | POA: Diagnosis not present

## 2016-11-14 DIAGNOSIS — Z113 Encounter for screening for infections with a predominantly sexual mode of transmission: Secondary | ICD-10-CM

## 2016-11-14 DIAGNOSIS — K5900 Constipation, unspecified: Secondary | ICD-10-CM | POA: Diagnosis not present

## 2016-11-14 DIAGNOSIS — K4090 Unilateral inguinal hernia, without obstruction or gangrene, not specified as recurrent: Secondary | ICD-10-CM

## 2016-11-14 NOTE — Progress Notes (Signed)
46 y.o. [id number] SingleAfrican AmericanF here for annual exam.  H/O TLH/BS.  She has continues to have blood in her stool. She has had a colonoscopy, had polypectomy. Would like a referral to another GI. Bowel movements are irregular, affected by diet. She can be constipated or regular, sometimes strains. Has to try different over the counter medications, intermittently. She did have some relief with Lizness, seemed to help, doesn't have any more.  She has been diagnosed with a right inguinal hernia, intermittently bothers her, would like referral to a Surgeon.  No vaginal bleeding. Some night sweats, tolerable. No vaginal dryness. No dyspareunia. She was told she had syphillis last year, at the health department she was told it was a false positive.  Would like to do STD testing every year. Uses condoms. Same partner x 2 years, monogamous. Don't live together, very serious.  She has constipation and cold intolerance. No weight changes, no fatigue, no hair loss    Patient's last menstrual period was 09/26/2011.          Sexually active: Yes.    The current method of family planning is status post hysterectomy.    Exercising: No.  The patient does not participate in regular exercise at present. Smoker:  no  Health Maintenance: Pap:  11-04-14 WNl NEG HR HPV History of abnormal Pap:  Yes, prior to her hysterectomy, had a leep.  MMG:  11-15-15 WNL  Colonoscopy:  12-31-15 Tubular Adenoma  BMD:   Never TDaP:  10-30-2012  Gardasil: N/A   reports that she has never smoked. She has never used smokeless tobacco. She reports that she drinks about 0.6 - 1.2 oz of alcohol per week . She reports that she does not use drugs.She manages [employer], she works a lot.   Past Medical History:  Diagnosis Date  . AC (acromioclavicular) joint bone spurs    left foot  . Fibroid uterus   . Migraine headache without aura   . No pertinent past medical history   . STD (sexually transmitted disease)    HSV II    Past Surgical History:  Procedure Laterality Date  . BILATERAL SALPINGECTOMY  01/09/2012   Procedure: BILATERAL SALPINGECTOMY;  Surgeon: Azalia Bilis, MD;  Location: [facility];  Service: Gynecology;  Laterality: Bilateral;  . CYSTOSCOPY  01/09/2012   Procedure: CYSTOSCOPY;  Surgeon: Azalia Bilis, MD;  Location: [facility];  Service: Gynecology;  Laterality: N/A;  . EYE SURGERY    . FOOT SURGERY  2013   bone spurs removed  . fractured wrist Right 2008  . removal of uterine fibroids     in pt's 20's   . ROBOTIC ASSISTED TOTAL HYSTERECTOMY  01/09/2012   Procedure: ROBOTIC ASSISTED TOTAL HYSTERECTOMY;  Surgeon: Azalia Bilis, MD;  Location: [facility];  Service: Gynecology;  Laterality: N/A;  . WISDOM TOOTH EXTRACTION  08/06/2015    Current Outpatient Prescriptions  Medication Sig Dispense Refill  . cetirizine (ZYRTEC) 10 MG tablet Take 10 mg by mouth.    . Multiple Vitamin (MULTIVITAMIN WITH MINERALS) TABS Take 1 tablet by mouth daily.    Marland Kitchen topiramate (TOPAMAX) 25 MG tablet Take 25 mg by mouth 2 (two) times daily.    . valACYclovir (VALTREX) 1000 MG tablet Take 1 tablet (1,000 mg total) by mouth daily. 90 tablet 0   No current facility-administered medications for this visit.     Family History  Problem Relation Age of Onset  . Adopted: Yes  . Asthma  Mother   . Diabetes Mother        insulin dependent    Review of Systems  Constitutional: Negative.   HENT: Negative.   Eyes: Negative.   Respiratory: Negative.   Cardiovascular: Negative.   Gastrointestinal: Positive for abdominal pain, blood in stool and constipation.       Bloating   Endocrine: Positive for cold intolerance.  Genitourinary: Negative.   Musculoskeletal: Positive for myalgias.  Skin: Negative.   Allergic/Immunologic: Negative.   Neurological: Negative.   Psychiatric/Behavioral: Negative.     Exam:   BP 124/80 (BP Location: Right Arm, Patient Position: Sitting, Cuff Size: Normal)   Pulse 64    Resp 16   Ht 5' 6.5" (1.689 m)   Wt 237 lb (107.5 kg)   LMP 09/26/2011   BMI 37.68 kg/m   Weight change: @WEIGHTCHANGE @ Height:   Height: 5' 6.5" (168.9 cm)  Ht Readings from Last 3 Encounters:  11/14/16 5' 6.5" (1.689 m)  12/17/15 5\' 7"  (1.702 m)  11/17/15 5\' 7"  (1.702 m)    General appearance: alert, cooperative and appears stated age Head: Normocephalic, without obvious abnormality, atraumatic Neck: no adenopathy, supple, symmetrical, trachea midline and thyroid normal to inspection and palpation Lungs: clear to auscultation bilaterally Cardiovascular: regular rate and rhythm Breasts: normal appearance, no masses or tenderness Abdomen: soft, non-tender; non distended,  no masses,  no organomegaly Extremities: extremities normal, atraumatic, no cyanosis or edema Skin: Skin color, texture, turgor normal. No rashes or lesions Lymph nodes: Cervical, supraclavicular, and axillary nodes normal. No abnormal inguinal nodes palpated Neurologic: Grossly normal   Pelvic: External genitalia:  no lesions              Urethra:  normal appearing urethra with no masses, tenderness or lesions              Bartholins and Skenes: normal                 Vagina: normal appearing vagina with normal color and discharge, no lesions              Cervix: absent               Bimanual Exam:  Uterus:  uterus absent              Adnexa: no mass, fullness, tenderness               Rectovaginal: Confirms               Anus:  normal sphincter tone, no lesions  Chaperone was present for exam.  A:  Well Woman with normal exam  Cold intolerance, constipation  GI issues  H/O inguinal hernia, intermittently symptomatic  H/O hysterectomy, prior to that had a leep  H/O HSV  P:   Screening labs with primary  Will check a TSH  No pap this year, will check every 3-5 years  Mammogram next month  Will refer to a new GI  Refer to general surgery  Referral to General Surgeon  Valtrex for prn use (already  called in)

## 2016-11-14 NOTE — Patient Instructions (Signed)

## 2016-11-15 LAB — HEPATITIS B SURFACE ANTIGEN: HEP B S AG: NEGATIVE

## 2016-11-15 LAB — GC/CHLAMYDIA PROBE AMP
Chlamydia trachomatis, NAA: NEGATIVE
NEISSERIA GONORRHOEAE BY PCR: NEGATIVE

## 2016-11-15 LAB — HIV ANTIBODY (ROUTINE TESTING W REFLEX): HIV Screen 4th Generation wRfx: NONREACTIVE

## 2016-11-15 LAB — TSH: TSH: 0.697 u[IU]/mL (ref 0.450–4.500)

## 2016-11-15 LAB — FLUORESCENT TREPONEMAL AB(FTA)-IGG-BLD: FLUORESCENT TREPONEMAL AB, IGG: NONREACTIVE

## 2016-11-15 LAB — HEPATITIS C ANTIBODY: Hep C Virus Ab: <0.1 s/co ratio (ref 0.0–0.9)

## 2016-11-16 ENCOUNTER — Other Ambulatory Visit: Payer: Self-pay | Admitting: Obstetrics and Gynecology

## 2016-11-16 DIAGNOSIS — Z1231 Encounter for screening mammogram for malignant neoplasm of breast: Secondary | ICD-10-CM

## 2016-11-20 ENCOUNTER — Telehealth: Payer: Self-pay | Admitting: Obstetrics and Gynecology

## 2016-11-20 NOTE — Telephone Encounter (Signed)
Left voicemail regarding referral appointment. The information is listed below. Should the patient need to cancel or reschedule this appointment, Please advise them to call the office they've been referred to in order to reschedule.  [place], [place]  Phone: [phone]  Dr. Kae Heller 11/29/16 @ 11:15 am. Please arrive 30 minutes early and bring your insurance card and photo id and list of medications.

## 2016-11-24 NOTE — Telephone Encounter (Signed)
Patient requesting Dr.Nandigam review her records. Records have been placed on Dr.Nandigam's desk for review.

## 2016-11-28 ENCOUNTER — Encounter: Payer: Self-pay | Admitting: Obstetrics and Gynecology

## 2016-11-28 NOTE — Telephone Encounter (Signed)
Dr.Nandigam reviewed records and declined to accept transfer at present. Patient has been notified  of Dr.Nandigam's decision.

## 2016-11-29 ENCOUNTER — Telehealth: Payer: Self-pay

## 2016-11-29 DIAGNOSIS — K5900 Constipation, unspecified: Secondary | ICD-10-CM

## 2016-11-29 DIAGNOSIS — K4090 Unilateral inguinal hernia, without obstruction or gangrene, not specified as recurrent: Secondary | ICD-10-CM

## 2016-11-29 DIAGNOSIS — K921 Melena: Secondary | ICD-10-CM

## 2016-11-29 NOTE — Telephone Encounter (Signed)
Do you know why [facility] won't see her? Please set her up else where (ie [facility] or [facility])

## 2016-11-29 NOTE — Telephone Encounter (Signed)
Visit Follow-Up Question  Message [id number]  From Flor Whitacre To Salvadore Dom, MD Sent 11/28/2016 10:23 PM  Good evening,  Would like to have another referral for a new gastroenterologist. Perhaps someone that is not in the [facility] group. I am not have any positive results in being seen by a new physician. Just want to be taken care of.   Thank you  Sharyn Lull   Responsible Party   Pool - [facility] Pool No one has taken responsibility for this message.  No actions have been taken on this message.   Dr.Jertson, patient was previously seeing Dr.Mann. Requested transfer of care. Records were sent to [facility] and reviewed by Dr.Pyrtle who is "unable to accept" the patient at the present. Records reviewed by Dr.Nandigam and she decline to accept transfer of patient at the present. Please advise.

## 2016-11-29 NOTE — Telephone Encounter (Signed)
Please see telephone encounter dated with today's date. 

## 2016-11-30 NOTE — Telephone Encounter (Signed)
Left message to call Kaitlyn at [phone]. 

## 2016-12-01 NOTE — Telephone Encounter (Signed)
Return call to Kaitlyn. °

## 2016-12-01 NOTE — Telephone Encounter (Signed)
Left message to call Baraka Klatt at [phone]. 

## 2016-12-04 NOTE — Telephone Encounter (Signed)
Spoke with patient. Patient would like referral to [facility]. Referral to [facility] placed. Patient is aware Gaspar Cola will contact her to schedule her appointment after reviewing her records. Patient is agreeable.  Routing to [employer]  for referral fax and coordination.  Routing to provider for final review. Patient agreeable to disposition. Will close encounter.

## 2016-12-05 ENCOUNTER — Ambulatory Visit
Admission: RE | Admit: 2016-12-05 | Discharge: 2016-12-05 | Disposition: A | Discharge status: Home / Self Care | Payer: BLUE CROSS/BLUE SHIELD | Source: Ambulatory Visit | Attending: Obstetrics and Gynecology | Admitting: Obstetrics and Gynecology

## 2016-12-05 DIAGNOSIS — Z1231 Encounter for screening mammogram for malignant neoplasm of breast: Secondary | ICD-10-CM

## 2016-12-15 ENCOUNTER — Telehealth: Payer: Self-pay | Admitting: Obstetrics and Gynecology

## 2016-12-15 NOTE — Telephone Encounter (Signed)
[facility] called to report that this patient is scheduled 12/18/16 @11 :30am.

## 2016-12-19 ENCOUNTER — Other Ambulatory Visit: Payer: Self-pay | Admitting: Surgery

## 2016-12-19 DIAGNOSIS — K429 Umbilical hernia without obstruction or gangrene: Secondary | ICD-10-CM

## 2017-01-10 ENCOUNTER — Other Ambulatory Visit: Payer: 59

## 2017-01-17 ENCOUNTER — Ambulatory Visit
Admission: RE | Admit: 2017-01-17 | Discharge: 2017-01-17 | Disposition: A | Discharge status: Home / Self Care | Payer: 59 | Source: Ambulatory Visit | Attending: Surgery | Admitting: Surgery

## 2017-01-17 DIAGNOSIS — K429 Umbilical hernia without obstruction or gangrene: Secondary | ICD-10-CM

## 2017-01-17 MED ORDER — IOPAMIDOL (ISOVUE-300) INJECTION 61%
125.0000 mL | Freq: Once | INTRAVENOUS | Status: AC | PRN
  Administered 2017-01-17: 125 mL via INTRAVENOUS

## 2017-03-29 ENCOUNTER — Other Ambulatory Visit: Payer: Self-pay | Admitting: Obstetrics and Gynecology

## 2017-03-29 NOTE — Telephone Encounter (Signed)
Medication refill request: valtres 1gm #[id number]--last refill 11-13-16 Last AEX:  11-14-16 Next AEX: 11-19-17 Last MMG (if hormonal medication request): 12-05-16 [id number] Refill authorized: Please advise

## 2017-11-19 ENCOUNTER — Ambulatory Visit: Payer: 59 | Admitting: Obstetrics and Gynecology

## 2017-11-19 ENCOUNTER — Other Ambulatory Visit: Payer: Self-pay

## 2017-11-19 ENCOUNTER — Other Ambulatory Visit (HOSPITAL_COMMUNITY)
Admission: RE | Admit: 2017-11-19 | Discharge: 2017-11-19 | Disposition: A | Discharge status: Home / Self Care | Payer: 59 | Source: Ambulatory Visit | Attending: Obstetrics and Gynecology | Admitting: Obstetrics and Gynecology

## 2017-11-19 ENCOUNTER — Other Ambulatory Visit: Payer: Self-pay | Admitting: Obstetrics and Gynecology

## 2017-11-19 ENCOUNTER — Encounter: Payer: Self-pay | Admitting: Obstetrics and Gynecology

## 2017-11-19 VITALS — BP 128/80 | HR 72 | Ht 67.0 in | Wt 210.8 lb

## 2017-11-19 DIAGNOSIS — Z1272 Encounter for screening for malignant neoplasm of vagina: Secondary | ICD-10-CM | POA: Diagnosis not present

## 2017-11-19 DIAGNOSIS — Z113 Encounter for screening for infections with a predominantly sexual mode of transmission: Secondary | ICD-10-CM | POA: Diagnosis not present

## 2017-11-19 DIAGNOSIS — R102 Pelvic and perineal pain: Secondary | ICD-10-CM

## 2017-11-19 DIAGNOSIS — Z01419 Encounter for gynecological examination (general) (routine) without abnormal findings: Secondary | ICD-10-CM

## 2017-11-19 DIAGNOSIS — Z1231 Encounter for screening mammogram for malignant neoplasm of breast: Secondary | ICD-10-CM

## 2017-11-19 DIAGNOSIS — R109 Unspecified abdominal pain: Secondary | ICD-10-CM | POA: Diagnosis not present

## 2017-11-19 MED ORDER — VALACYCLOVIR HCL 1 G PO TABS: ORAL_TABLET | ORAL | 3 refills | Status: DC

## 2017-11-19 NOTE — Patient Instructions (Signed)

## 2017-11-19 NOTE — Progress Notes (Signed)
47 y.o. G2P0020 Single Black or African American Not Hispanic or Latino female here for annual exam.  H/O TLH/BS. H/O LEEP prior to that.  She has lost 27 lbs since last year. Working on weight loss. Eating better. Bowels are better. No urinary c/o. Same long term partner x 3 years. Don't live together. No dyspareunia. She c/o a 6 month h/o intermittent LLQ abdominal pain, can occur with movement or twisting.  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 She denies having vasomotor symptoms at the time of her hysterectomy. She has been warmer recently, but no distinct vasomotor symptoms. H/O genital hsv, prn use of valtrex. Occurs with increase in stress.      Patient's last menstrual period was 09/26/2011.          Sexually active: Yes.    The current method of family planning is condoms most of the time.  Hysterectomy.  Exercising: No.  The patient does not participate in regular exercise at present. Smoker:  no  Health Maintenance: Pap:  11-04-14 WNl NEG HR HPV History of abnormal Pap:  Yes, prior to her hysterectomy, had a leep.  MMG: 12/05/2016 Birad 1 negative Colonoscopy:  12-31-15 Tubular Adenoma, she is due.   BMD:   Never TDaP:  10-30-2012  Gardasil: N/A   reports that she has never smoked. She has never used smokeless tobacco. She reports that she drinks about 1.0 - 2.0 standard drinks of alcohol per week. She reports that she does not use drugs. She manages a [employer]  Past Medical History:  Diagnosis Date  . AC (acromioclavicular) joint bone spurs    left foot  . Cataract, right eye   . Fibroid uterus   . Migraine headache without aura   . No pertinent past medical history   . STD (sexually transmitted disease)    HSV II    Past Surgical History:  Procedure Laterality Date  . BILATERAL SALPINGECTOMY  01/09/2012   Procedure: BILATERAL SALPINGECTOMY;  Surgeon: Azalia Bilis, MD;  Location: [facility];  Service: Gynecology;  Laterality:  Bilateral;  . CYSTOSCOPY  01/09/2012   Procedure: CYSTOSCOPY;  Surgeon: Azalia Bilis, MD;  Location: [facility];  Service: Gynecology;  Laterality: N/A;  . EYE SURGERY    . FOOT SURGERY  2013   bone spurs removed  . fractured wrist Right 2008  . removal of uterine fibroids     in pt's 20's   . ROBOTIC ASSISTED TOTAL HYSTERECTOMY  01/09/2012   Procedure: ROBOTIC ASSISTED TOTAL HYSTERECTOMY;  Surgeon: Azalia Bilis, MD;  Location: [facility];  Service: Gynecology;  Laterality: N/A;  . WISDOM TOOTH EXTRACTION  08/06/2015    Current Outpatient Medications  Medication Sig Dispense Refill  . cetirizine (ZYRTEC) 10 MG tablet Take 10 mg by mouth.    . Multiple Vitamin (MULTIVITAMIN WITH MINERALS) TABS Take 1 tablet by mouth daily.    . phentermine 37.5 MG capsule Take 37.5 mg by mouth every morning.    . topiramate (TOPAMAX) 25 MG tablet Take 25 mg by mouth 2 (two) times daily.    . valACYclovir (VALTREX) 1000 MG tablet TAKE 1 TABLET(1000 MG) BY MOUTH DAILY 90 tablet 2   No current facility-administered medications for this visit.     Family History  Adopted: Yes  Problem Relation Age of Onset  . Asthma Mother   . Diabetes Mother        insulin dependent    Review of Systems  Constitutional: Negative.   HENT: Negative.   Eyes: Negative.   Respiratory: Negative.   Cardiovascular: Negative.   Gastrointestinal: Negative.   Endocrine: Negative.   Genitourinary: Positive for pelvic pain.  Musculoskeletal: Negative.   Skin: Negative.   Allergic/Immunologic: Negative.   Neurological: Negative.   Hematological: Negative.   Psychiatric/Behavioral: Negative.     Exam:   BP 128/80 (BP Location: Right Arm, Patient Position: Sitting, Cuff Size: Normal)   Pulse 72   Ht 5\' 7"  (1.702 m)   Wt 210 lb 12.8 oz (95.6 kg)   LMP 09/26/2011   BMI 33.02 kg/m   Weight change: @WEIGHTCHANGE @ Height:   Height: 5\' 7"  (170.2 cm)  Ht Readings from Last 3 Encounters:  11/19/17 5\' 7"  (1.702 m)   11/14/16 5' 6.5" (1.689 m)  12/17/15 5\' 7"  (1.702 m)    General appearance: alert, cooperative and appears stated age Head: Normocephalic, without obvious abnormality, atraumatic Neck: no adenopathy, supple, symmetrical, trachea midline and thyroid normal to inspection and palpation Lungs: clear to auscultation bilaterally Cardiovascular: regular rate and rhythm Breasts: normal appearance, no masses or tenderness Abdomen: soft, non-tender; non distended,  no masses,  no organomegaly Extremities: extremities normal, atraumatic, no cyanosis or edema Skin: Skin color, texture, turgor normal. No rashes or lesions Lymph nodes: Cervical, supraclavicular, and axillary nodes normal. No abnormal inguinal nodes palpated Neurologic: Grossly normal   Pelvic: External genitalia:  no lesions              Urethra:  normal appearing urethra with no masses, tenderness or lesions              Bartholins and Skenes: normal                 Vagina: normal appearing vagina with normal color and discharge, no lesions              Cervix: absent               Bimanual Exam:  Uterus:  normal size, contour, position, consistency, mobility, non-tender              Adnexa: no mass, fullness, tenderness               Rectovaginal: Confirms               Anus:  normal sphincter tone, no lesions  Chaperone was present for exam.  A:  Well Woman with normal exam  LLQ abdominal/pelvic pain intermittent x 6 months. Per pathology report she still has her ovaries.   P:   Pap from vaginal apex  Lab work with her weight loss management physician  Return for gyn ultrasound  Discussed breast self exam  Discussed calcium and vit D intake  Mammogram next month  She will schedule her colonoscopy

## 2017-11-20 ENCOUNTER — Telehealth: Payer: Self-pay | Admitting: Obstetrics and Gynecology

## 2017-11-20 LAB — HEP, RPR, HIV PANEL
HEP B S AG: NEGATIVE
HIV Screen 4th Generation wRfx: NONREACTIVE
RPR: NONREACTIVE

## 2017-11-20 LAB — HEPATITIS C ANTIBODY: Hep C Virus Ab: <0.1 s/co ratio (ref 0.0–0.9)

## 2017-11-20 NOTE — Telephone Encounter (Signed)
Call placed to review benefits and schedule ultrasound. Unable to reach patient at mobile number [phone], message states "this is not a working number".  I placed call to the home number on file an received an automated response "not a reachable number [phone]".

## 2017-11-22 LAB — CYTOLOGY - PAP
CHLAMYDIA, DNA PROBE: NEGATIVE
HPV (WINDOPATH): NOT DETECTED
Neisseria Gonorrhea: NEGATIVE
TRICH (WINDOWPATH): NEGATIVE

## 2017-11-29 ENCOUNTER — Encounter: Payer: Self-pay | Admitting: Emergency Medicine

## 2017-11-29 ENCOUNTER — Telehealth: Payer: Self-pay | Admitting: Emergency Medicine

## 2017-11-29 NOTE — Telephone Encounter (Signed)
Call to patient. Mobile number not in service.   Mychart message sent.  Also needs to schedule ultrasound.  If wants ultrasound and pap on the same day, needs pap first before ultrasound gel is applied.

## 2017-11-29 NOTE — Telephone Encounter (Signed)
-----   Message from Salvadore Dom, MD sent at 11/22/2017  3:55 PM EDT ----- Please inform and have her come back for another pap. Please check with pathology as to the accuracy of the HP, GC/CT/Trich is the pap was unsatisfactory

## 2017-12-18 ENCOUNTER — Other Ambulatory Visit: Payer: Self-pay

## 2017-12-18 ENCOUNTER — Encounter: Payer: Self-pay | Admitting: Obstetrics and Gynecology

## 2017-12-18 ENCOUNTER — Ambulatory Visit (INDEPENDENT_AMBULATORY_CARE_PROVIDER_SITE_OTHER): Payer: 59 | Admitting: Obstetrics and Gynecology

## 2017-12-18 ENCOUNTER — Ambulatory Visit: Payer: 59

## 2017-12-18 ENCOUNTER — Other Ambulatory Visit (HOSPITAL_COMMUNITY)
Admission: RE | Admit: 2017-12-18 | Discharge: 2017-12-18 | Disposition: A | Discharge status: Home / Self Care | Payer: 59 | Source: Ambulatory Visit | Attending: Obstetrics and Gynecology | Admitting: Obstetrics and Gynecology

## 2017-12-18 VITALS — BP 112/82 | HR 72 | Wt 209.4 lb

## 2017-12-18 DIAGNOSIS — R109 Unspecified abdominal pain: Secondary | ICD-10-CM

## 2017-12-18 DIAGNOSIS — R87625 Unsatisfactory cytologic smear of vagina: Secondary | ICD-10-CM | POA: Insufficient documentation

## 2017-12-18 DIAGNOSIS — R102 Pelvic and perineal pain: Secondary | ICD-10-CM

## 2017-12-18 DIAGNOSIS — Z1272 Encounter for screening for malignant neoplasm of vagina: Secondary | ICD-10-CM

## 2017-12-18 NOTE — Progress Notes (Signed)
GYNECOLOGY  VISIT   HPI: 47 y.o.   Single Black or African American Not Hispanic or Latino  female   G2P0020 with Patient's last menstrual period was 09/26/2011.   here for consult following PUS.  The patient has a 7 month h/o intermittent LLQ abdominal/pelvic pain. She has a h/o a TLH/BS, still has her ovaries.  She also has a h/o cervical dysplasia. A pap was done from the vaginal apex last month at her annual exam and returned as unsatisfactory secondary to "excessive cytolysis".   GYNECOLOGIC HISTORY: Patient's last menstrual period was 09/26/2011. Contraception: BTL Menopausal hormone therapy: None       OB History    Gravida  2   Para  0   Term  0   Preterm  0   AB  2   Living  0     SAB  0   TAB  0   Ectopic  0   Multiple  0   Live Births  0              Patient Active Problem List   Diagnosis Date Noted  . Migraine without aura and responsive to treatment 02/22/2015  . Acne 08/04/2013  . Fatigue 08/04/2013  . Adult BMI 30+ 08/04/2013  . Rectal bleeding 04/29/2012  . 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 AC (acromioclavicular) joint bone spurs    left foot  . Cataract, right eye   . Fibroid uterus   . Migraine headache without aura   . No pertinent past medical history   . STD (sexually transmitted disease)    HSV II    Past Surgical History:  Procedure Laterality Date  . BILATERAL SALPINGECTOMY  01/09/2012   Procedure: BILATERAL SALPINGECTOMY;  Surgeon: Azalia Bilis, MD;  Location: [facility];  Service: Gynecology;  Laterality: Bilateral;  . CYSTOSCOPY  01/09/2012   Procedure: CYSTOSCOPY;  Surgeon: Azalia Bilis, MD;  Location: [facility];  Service: Gynecology;  Laterality: N/A;  . EYE SURGERY    . FOOT SURGERY  2013   bone spurs removed  . fractured wrist Right 2008  . removal of uterine fibroids     in pt's 20's   . ROBOTIC ASSISTED TOTAL HYSTERECTOMY  01/09/2012   Procedure:  ROBOTIC ASSISTED TOTAL HYSTERECTOMY;  Surgeon: Azalia Bilis, MD;  Location: [facility];  Service: Gynecology;  Laterality: N/A;  . WISDOM TOOTH EXTRACTION  08/06/2015    Current Outpatient Medications  Medication Sig Dispense Refill  . cetirizine (ZYRTEC) 10 MG tablet Take 10 mg by mouth.    . Multiple Vitamin (MULTIVITAMIN WITH MINERALS) TABS Take 1 tablet by mouth daily.    . phentermine 37.5 MG capsule Take 37.5 mg by mouth every morning.    . topiramate (TOPAMAX) 25 MG tablet Take 25 mg by mouth 2 (two) times daily.    . valACYclovir (VALTREX) 1000 MG tablet TAKE 1 TABLET(1000 MG) BY MOUTH DAILY 90 tablet 3   No current facility-administered medications for this visit.      ALLERGIES: Latex  Family History  Adopted: Yes  Problem Relation Age of Onset  . Asthma Mother   . Diabetes Mother        insulin dependent    Social History   Socioeconomic History  . Marital status: Single    Spouse name: Not on file  . Number of children: Not on file  . Years of education:  Not on file  . Highest education level: Not on file  Occupational History  . Not on file  Social Needs  . Financial resource strain: Not on file  . Food insecurity:    Worry: Not on file    Inability: Not on file  . Transportation needs:    Medical: Not on file    Non-medical: Not on file  Tobacco Use  . Smoking status: Never Smoker  . Smokeless tobacco: Never Used  Substance and Sexual Activity  . Alcohol use: Yes    Alcohol/week: 1.0 - 2.0 standard drinks    Types: 1 - 2 Glasses of wine per week  . Drug use: No  . Sexual activity: Yes    Partners: Male    Birth control/protection: Condom, Surgical    Comment: R-TLH 12/2011  Lifestyle  . Physical activity:    Days per week: Not on file    Minutes per session: Not on file  . Stress: Not on file  Relationships  . Social connections:    Talks on phone: Not on file    Gets together: Not on file    Attends religious service: Not on file     Active member of club or organization: Not on file    Attends meetings of clubs or organizations: Not on file    Relationship status: Not on file  . Intimate partner violence:    Fear of current or ex partner: Not on file    Emotionally abused: Not on file    Physically abused: Not on file    Forced sexual activity: Not on file  Other Topics Concern  . Not on file  Social History Narrative  . Not on file    Review of Systems  Constitutional: Negative.   HENT: Negative.   Eyes: Negative.   Respiratory: Negative.   Cardiovascular: Negative.   Gastrointestinal: Positive for abdominal pain.  Genitourinary: Negative.   Musculoskeletal: Negative.   Skin: Negative.   Neurological: Negative.   Endo/Heme/Allergies: Negative.   Psychiatric/Behavioral: Negative.     PHYSICAL EXAMINATION:    BP 112/82 (BP Location: Right Arm, Patient Position: Sitting, Cuff Size: Normal)   Pulse 72   Wt 209 lb 6.4 oz (95 kg)   LMP 09/26/2011   BMI 32.80 kg/m     General appearance: alert, cooperative and appears stated age  Pelvic: External genitalia:  no lesions              Urethra:  normal appearing urethra with no masses, tenderness or lesions              Bartholins and Skenes: normal                 Vagina: normal appearing vagina with normal color and discharge, no lesions              Cervix: absent  Chaperone was present for exam.  ASSESSMENT Intermittent LLQ abdominal/pelvic pain for the last 7 months. Normal ovaries on gyn ultrasound, some signs of bowel inflammation on ultrasound Unsatisfactory pap from vaginal apex, h/o cervical dysplasia    PLAN F/U with primary for abdominal pain and suspected bowel inflammation (h/o diverticulosis, suspect diverticulitis) She is also due for colonoscopy (h/o polyp) Pap from vaginal apex   An After Visit Summary was printed and given to the patient.  Cc: Dr Theodis Sato

## 2017-12-19 LAB — CYTOLOGY - PAP: DIAGNOSIS: NEGATIVE

## 2017-12-31 ENCOUNTER — Ambulatory Visit
Admission: RE | Admit: 2017-12-31 | Discharge: 2017-12-31 | Disposition: A | Discharge status: Home / Self Care | Payer: 59 | Source: Ambulatory Visit | Attending: Obstetrics and Gynecology | Admitting: Obstetrics and Gynecology

## 2017-12-31 DIAGNOSIS — Z1231 Encounter for screening mammogram for malignant neoplasm of breast: Secondary | ICD-10-CM

## 2018-08-21 DIAGNOSIS — M21612 Bunion of left foot: Secondary | ICD-10-CM | POA: Diagnosis not present

## 2018-08-21 DIAGNOSIS — M71572 Other bursitis, not elsewhere classified, left ankle and foot: Secondary | ICD-10-CM | POA: Diagnosis not present

## 2018-08-21 DIAGNOSIS — M7662 Achilles tendinitis, left leg: Secondary | ICD-10-CM | POA: Diagnosis not present

## 2018-08-21 DIAGNOSIS — M79671 Pain in right foot: Secondary | ICD-10-CM | POA: Diagnosis not present

## 2018-08-21 DIAGNOSIS — M79672 Pain in left foot: Secondary | ICD-10-CM | POA: Diagnosis not present

## 2018-08-21 DIAGNOSIS — M21611 Bunion of right foot: Secondary | ICD-10-CM | POA: Diagnosis not present

## 2018-08-29 DIAGNOSIS — Z86010 Personal history of colonic polyps: Secondary | ICD-10-CM | POA: Diagnosis not present

## 2018-08-29 DIAGNOSIS — K921 Melena: Secondary | ICD-10-CM | POA: Diagnosis not present

## 2018-08-29 DIAGNOSIS — K5900 Constipation, unspecified: Secondary | ICD-10-CM | POA: Diagnosis not present

## 2018-09-04 DIAGNOSIS — R933 Abnormal findings on diagnostic imaging of other parts of digestive tract: Secondary | ICD-10-CM | POA: Diagnosis not present

## 2018-09-04 DIAGNOSIS — K602 Anal fissure, unspecified: Secondary | ICD-10-CM | POA: Diagnosis not present

## 2018-09-04 DIAGNOSIS — K5730 Diverticulosis of large intestine without perforation or abscess without bleeding: Secondary | ICD-10-CM | POA: Diagnosis not present

## 2018-09-04 DIAGNOSIS — M21611 Bunion of right foot: Secondary | ICD-10-CM | POA: Diagnosis not present

## 2018-09-04 DIAGNOSIS — K921 Melena: Secondary | ICD-10-CM | POA: Diagnosis not present

## 2018-09-04 DIAGNOSIS — M25571 Pain in right ankle and joints of right foot: Secondary | ICD-10-CM | POA: Diagnosis not present

## 2018-09-04 DIAGNOSIS — Z86010 Personal history of colonic polyps: Secondary | ICD-10-CM | POA: Diagnosis not present

## 2018-10-08 DIAGNOSIS — M21611 Bunion of right foot: Secondary | ICD-10-CM | POA: Diagnosis not present

## 2018-10-08 DIAGNOSIS — M25571 Pain in right ankle and joints of right foot: Secondary | ICD-10-CM | POA: Diagnosis not present

## 2018-10-08 DIAGNOSIS — Z79899 Other long term (current) drug therapy: Secondary | ICD-10-CM | POA: Diagnosis not present

## 2018-10-08 DIAGNOSIS — Z01812 Encounter for preprocedural laboratory examination: Secondary | ICD-10-CM | POA: Diagnosis not present

## 2018-10-29 DIAGNOSIS — M2011 Hallux valgus (acquired), right foot: Secondary | ICD-10-CM | POA: Diagnosis not present

## 2018-10-29 DIAGNOSIS — M21611 Bunion of right foot: Secondary | ICD-10-CM | POA: Diagnosis not present

## 2018-11-04 DIAGNOSIS — M12271 Villonodular synovitis (pigmented), right ankle and foot: Secondary | ICD-10-CM | POA: Diagnosis not present

## 2018-11-12 DIAGNOSIS — M12271 Villonodular synovitis (pigmented), right ankle and foot: Secondary | ICD-10-CM | POA: Diagnosis not present

## 2018-11-21 ENCOUNTER — Ambulatory Visit: Payer: BC Managed Care – PPO | Admitting: Obstetrics and Gynecology

## 2018-11-21 ENCOUNTER — Other Ambulatory Visit: Payer: Self-pay

## 2018-11-21 ENCOUNTER — Encounter: Payer: Self-pay | Admitting: Obstetrics and Gynecology

## 2018-11-21 VITALS — BP 118/64 | HR 76 | Temp 97.2°F | Resp 12 | Ht 67.0 in | Wt 208.0 lb

## 2018-11-21 DIAGNOSIS — R8290 Unspecified abnormal findings in urine: Secondary | ICD-10-CM | POA: Diagnosis not present

## 2018-11-21 DIAGNOSIS — Z01419 Encounter for gynecological examination (general) (routine) without abnormal findings: Secondary | ICD-10-CM

## 2018-11-21 DIAGNOSIS — Z0000 Encounter for general adult medical examination without abnormal findings: Secondary | ICD-10-CM | POA: Diagnosis not present

## 2018-11-21 DIAGNOSIS — N898 Other specified noninflammatory disorders of vagina: Secondary | ICD-10-CM

## 2018-11-21 DIAGNOSIS — Z6832 Body mass index (BMI) 32.0-32.9, adult: Secondary | ICD-10-CM

## 2018-11-21 DIAGNOSIS — Z113 Encounter for screening for infections with a predominantly sexual mode of transmission: Secondary | ICD-10-CM

## 2018-11-21 MED ORDER — TOPIRAMATE 25 MG PO TABS: 25.0000 mg | ORAL_TABLET | Freq: Every day | ORAL | 1 refills | Status: DC

## 2018-11-21 MED ORDER — VALACYCLOVIR HCL 1 G PO TABS: ORAL_TABLET | ORAL | 3 refills | Status: DC

## 2018-11-21 NOTE — Patient Instructions (Signed)
EXERCISE AND DIET:  We recommended that you start or continue a regular exercise program for good health. Regular exercise means any activity that makes your heart beat faster and makes you sweat.  We recommend exercising at least 30 minutes per day at least 3 days a week, preferably 4 or 5.  We also recommend a diet low in fat and sugar.  Inactivity, poor dietary choices and obesity can cause diabetes, heart attack, stroke, and kidney damage, among others.    ALCOHOL AND SMOKING:  Women should limit their alcohol intake to no more than 7 drinks/beers/glasses of wine (combined, not each!) per week. Moderation of alcohol intake to this level decreases your risk of breast cancer and liver damage. And of course, no recreational drugs are part of a healthy lifestyle.  And absolutely no smoking or even second hand smoke. Most people know smoking can cause heart and lung diseases, but did you know it also contributes to weakening of your bones? Aging of your skin?  Yellowing of your teeth and nails?  CALCIUM AND VITAMIN D:  Adequate intake of calcium and Vitamin D are recommended.  The recommendations for exact amounts of these supplements seem to change often, but generally speaking 1,200 mg of calcium (between diet and supplement) and 800 units of Vitamin D per day seems prudent. Certain women may benefit from higher intake of Vitamin D.  If you are among these women, your doctor will have told you during your visit.    PAP SMEARS:  Pap smears, to check for cervical cancer or precancers,  have traditionally been done yearly, although recent scientific advances have shown that most women can have pap smears less often.  However, every woman still should have a physical exam from her gynecologist every year. It will include a breast check, inspection of the vulva and vagina to check for abnormal growths or skin changes, a visual exam of the cervix, and then an exam to evaluate the size and shape of the uterus and  ovaries.  And after 50 years of age, a rectal exam is indicated to check for rectal cancers. We will also provide age appropriate advice regarding health maintenance, like when you should have certain vaccines, screening for sexually transmitted diseases, bone density testing, colonoscopy, mammograms, etc.   MAMMOGRAMS:  All women over 40 years old should have a yearly mammogram. Many facilities now offer a "3D" mammogram, which may cost around $50 extra out of pocket. If possible,  we recommend you accept the option to have the 3D mammogram performed.  It both reduces the number of women who will be called back for extra views which then turn out to be normal, and it is better than the routine mammogram at detecting truly abnormal areas.    COLON CANCER SCREENING: Now recommend starting at age 45. At this time colonoscopy is not covered for routine screening until 50. There are take home tests that can be done between 45-49.   COLONOSCOPY:  Colonoscopy to screen for colon cancer is recommended for all women at age 50.  We know, you hate the idea of the prep.  We agree, BUT, having colon cancer and not knowing it is worse!!  Colon cancer so often starts as a polyp that can be seen and removed at colonscopy, which can quite literally save your life!  And if your first colonoscopy is normal and you have no family history of colon cancer, most women don't have to have it again for   10 years.  Once every ten years, you can do something that may end up saving your life, right?  We will be happy to help you get it scheduled when you are ready.  Be sure to check your insurance coverage so you understand how much it will cost.  It may be covered as a preventative service at no cost, but you should check your particular policy.      Breast Self-Awareness Breast self-awareness means being familiar with how your breasts look and feel. It involves checking your breasts regularly and reporting any changes to your  health care provider. Practicing breast self-awareness is important. A change in your breasts can be a sign of a serious medical problem. Being familiar with how your breasts look and feel allows you to find any problems early, when treatment is more likely to be successful. All women should practice breast self-awareness, including women who have had breast implants. How to do a breast self-exam One way to learn what is normal for your breasts and whether your breasts are changing is to do a breast self-exam. To do a breast self-exam: Look for Changes  1. Remove all the clothing above your waist. 2. Stand in front of a mirror in a room with good lighting. 3. Put your hands on your hips. 4. Push your hands firmly downward. 5. Compare your breasts in the mirror. Look for differences between them (asymmetry), such as: ? Differences in shape. ? Differences in size. ? Puckers, dips, and bumps in one breast and not the other. 6. Look at each breast for changes in your skin, such as: ? Redness. ? Scaly areas. 7. Look for changes in your nipples, such as: ? Discharge. ? Bleeding. ? Dimpling. ? Redness. ? A change in position. Feel for Changes Carefully feel your breasts for lumps and changes. It is best to do this while lying on your back on the floor and again while sitting or standing in the shower or tub with soapy water on your skin. Feel each breast in the following way:  Place the arm on the side of the breast you are examining above your head.  Feel your breast with the other hand.  Start in the nipple area and make  inch (2 cm) overlapping circles to feel your breast. Use the pads of your three middle fingers to do this. Apply light pressure, then medium pressure, then firm pressure. The light pressure will allow you to feel the tissue closest to the skin. The medium pressure will allow you to feel the tissue that is a little deeper. The firm pressure will allow you to feel the tissue  close to the ribs.  Continue the overlapping circles, moving downward over the breast until you feel your ribs below your breast.  Move one finger-width toward the center of the body. Continue to use the  inch (2 cm) overlapping circles to feel your breast as you move slowly up toward your collarbone.  Continue the up and down exam using all three pressures until you reach your armpit.  Write Down What You Find  Write down what is normal for each breast and any changes that you find. Keep a written record with breast changes or normal findings for each breast. By writing this information down, you do not need to depend only on memory for size, tenderness, or location. Write down where you are in your menstrual cycle, if you are still menstruating. If you are having trouble noticing differences   in your breasts, do not get discouraged. With time you will become more familiar with the variations in your breasts and more comfortable with the exam. How often should I examine my breasts? Examine your breasts every month. If you are breastfeeding, the best time to examine your breasts is after a feeding or after using a breast pump. If you menstruate, the best time to examine your breasts is 5-7 days after your period is over. During your period, your breasts are lumpier, and it may be more difficult to notice changes. When should I see my health care provider? See your health care provider if you notice:  A change in shape or size of your breasts or nipples.  A change in the skin of your breast or nipples, such as a reddened or scaly area.  Unusual discharge from your nipples.  A lump or thick area that was not there before.  Pain in your breasts.  Anything that concerns you.  

## 2018-11-21 NOTE — Progress Notes (Signed)
48 y.o. G47P0020 Single Black or African American Not Hispanic or Latino female here for annual exam. Patient complains of having vaginal discharge and odor. Would also like HIV testing.   H/O hysterectomy/BS. She just broke up with her long term partner of 4 years.  She c/o intermittent vaginal discharge. Some urinary odor, intermittently.   H/O HSV, prn use of Valtrex    Foot surgery 3 weeks ago, healing well.   Patient's last menstrual period was 09/26/2011.          Sexually active: No.  The current method of family planning is status post hysterectomy.    Exercising: No.  The patient does not participate in regular exercise at present. Smoker:  no  Health Maintenance: Pap:12/18/2017 WNL, 11-04-14 WNl NEG HR HPV History of abnormal Pap:Yes, prior to her hysterectomy, had a leep. MMG:12/01/2017 Birads 1 negative Colonoscopy:12-31-15 Tubular Adenoma, Had a colonoscopy in early 2020 [id number] TDaP:10-30-2012 Gardasil:N/A   reports that she has never smoked. She has never used smokeless tobacco. She reports current alcohol use of about 1.0 - 2.0 standard drinks of alcohol per week. She reports that she does not use drugs. She manages a Lexicographer  Past Medical History:  Diagnosis Date  . AC (acromioclavicular) joint bone spurs    left foot  . Cataract, right eye   . Fibroid uterus   . Migraine headache without aura   . No pertinent past medical history   . STD (sexually transmitted disease)    HSV II    Past Surgical History:  Procedure Laterality Date  . BILATERAL SALPINGECTOMY  01/09/2012   Procedure: BILATERAL SALPINGECTOMY;  Surgeon: Azalia Bilis, MD;  Location: [facility];  Service: Gynecology;  Laterality: Bilateral;  . CYSTOSCOPY  01/09/2012   Procedure: CYSTOSCOPY;  Surgeon: Azalia Bilis, MD;  Location: [facility];  Service: Gynecology;  Laterality: N/A;  . EYE SURGERY    . FOOT SURGERY  2013   bone spurs removed  . FOOT SURGERY Right   .  fractured wrist Right 2008  . removal of uterine fibroids     in pt's 20's   . ROBOTIC ASSISTED TOTAL HYSTERECTOMY  01/09/2012   Procedure: ROBOTIC ASSISTED TOTAL HYSTERECTOMY;  Surgeon: Azalia Bilis, MD;  Location: [facility];  Service: Gynecology;  Laterality: N/A;  . WISDOM TOOTH EXTRACTION  08/06/2015    Current Outpatient Medications  Medication Sig Dispense Refill  . cetirizine (ZYRTEC) 10 MG tablet Take 10 mg by mouth.    Marland Kitchen ibuprofen (ADVIL) 800 MG tablet Take by mouth.    . LOMAIRA 8 MG TABS TK 1 T PO  D FOR 1 WEEK THEN INCREASE TO  BID    . Multiple Vitamin (MULTIVITAMIN WITH MINERALS) TABS Take 1 tablet by mouth daily.    . rizatriptan (MAXALT-MLT) 10 MG disintegrating tablet DISSOLVE 1 TABLET(10 MG) ON THE TONGUE 1 TIME AS NEEDED    . valACYclovir (VALTREX) 1000 MG tablet TAKE 1 TABLET(1000 MG) BY MOUTH DAILY 90 tablet 3  . topiramate (TOPAMAX) 25 MG tablet Take 1 tablet (25 mg total) by mouth daily. 30 tablet 1   No current facility-administered medications for this visit.     Family History  Adopted: Yes  Problem Relation Age of Onset  . Asthma Mother   . Diabetes Mother        insulin dependent  . Breast cancer Neg Hx     Review of Systems  Constitutional: Negative.   HENT: Negative.  Eyes: Negative.   Respiratory: Negative.   Cardiovascular: Negative.   Gastrointestinal: Negative.   Endocrine: Negative.   Genitourinary: Positive for vaginal discharge.  Musculoskeletal: Negative.   Skin: Negative.   Allergic/Immunologic: Negative.   Neurological: Negative.   Hematological: Negative.   Psychiatric/Behavioral: Negative.     Exam:   BP 118/64 (BP Location: Right Arm, Patient Position: Sitting, Cuff Size: Normal)   Pulse 76   Temp (!) 97.2 F (36.2 C) (Temporal)   Resp 12   Ht 5\' 7"  (1.702 m)   Wt 208 lb (94.3 kg)   LMP 09/26/2011   BMI 32.58 kg/m   Weight change: @WEIGHTCHANGE @ Height:   Height: 5\' 7"  (170.2 cm)  Ht Readings from Last 3 Encounters:   11/21/18 5\' 7"  (1.702 m)  11/19/17 5\' 7"  (1.702 m)  11/14/16 5' 6.5" (1.689 m)    General appearance: alert, cooperative and appears stated age Head: Normocephalic, without obvious abnormality, atraumatic Neck: no adenopathy, supple, symmetrical, trachea midline and thyroid normal to inspection and palpation Lungs: clear to auscultation bilaterally Cardiovascular: regular rate and rhythm Breasts: normal appearance, no masses or tenderness Abdomen: soft, non-tender; non distended,  no masses,  no organomegaly Extremities: extremities normal, atraumatic, no cyanosis or edema Skin: Skin color, texture, turgor normal. No rashes or lesions Lymph nodes: Cervical, supraclavicular, and axillary nodes normal. No abnormal inguinal nodes palpated Neurologic: Grossly normal   Pelvic: External genitalia:  no lesions              Urethra:  normal appearing urethra with no masses, tenderness or lesions              Bartholins and Skenes: normal                 Vagina: normal appearing vagina with a slight increase in watery, white vaginal d/c              Cervix: absent               Bimanual Exam:  Uterus:  uterus absent              Adnexa: no mass, fullness, tenderness               Rectovaginal: Confirms               Anus:  normal sphincter tone, no lesions  Chaperone was present for exam.  A:  Well Woman with normal exam  Urine odor  Vaginal discharge  Screening STD  Headaches, refilled her topamax short term until she can get in with her primary  P:   Urine for ua, c&s  Screening labs  Screening STD  Affirm  Discussed breast self exam  Discussed calcium and vit D intake  Mammogram due  Get a copy of colonoscopy  Condoms if sexually active

## 2018-11-22 LAB — COMPREHENSIVE METABOLIC PANEL
ALT: 20 IU/L (ref 0–32)
AST: 18 IU/L (ref 0–40)
Albumin/Globulin Ratio: 1.4 (ref 1.2–2.2)
Albumin: 4 g/dL (ref 3.8–4.8)
Alkaline Phosphatase: 63 IU/L (ref 39–117)
BUN/Creatinine Ratio: 18 (ref 9–23)
BUN: 21 mg/dL (ref 6–24)
Bilirubin Total: <0.2 mg/dL (ref 0.0–1.2)
CO2: 25 mmol/L (ref 20–29)
Calcium: 9.7 mg/dL (ref 8.7–10.2)
Chloride: 102 mmol/L (ref 96–106)
Creatinine, Ser: 1.17 mg/dL — ABNORMAL HIGH (ref 0.57–1.00)
GFR calc Af Amer: 63 mL/min/{1.73_m2} (ref >59)
GFR calc non Af Amer: 54 mL/min/{1.73_m2} — ABNORMAL LOW (ref >59)
Globulin, Total: 2.8 g/dL (ref 1.5–4.5)
Glucose: 78 mg/dL (ref 65–99)
Potassium: 4.7 mmol/L (ref 3.5–5.2)
Sodium: 137 mmol/L (ref 134–144)
Total Protein: 6.8 g/dL (ref 6.0–8.5)

## 2018-11-22 LAB — URINALYSIS, MICROSCOPIC ONLY
Bacteria, UA: NONE SEEN
Casts: NONE SEEN /lpf
WBC, UA: NONE SEEN /hpf (ref 0–5)

## 2018-11-22 LAB — CBC
Hematocrit: 37.8 % (ref 34.0–46.6)
Hemoglobin: 12.7 g/dL (ref 11.1–15.9)
MCH: 31.4 pg (ref 26.6–33.0)
MCHC: 33.6 g/dL (ref 31.5–35.7)
MCV: 93 fL (ref 79–97)
Platelets: 281 10*3/uL (ref 150–450)
RBC: 4.05 x10E6/uL (ref 3.77–5.28)
RDW: 12.3 % (ref 11.7–15.4)
WBC: 4.7 10*3/uL (ref 3.4–10.8)

## 2018-11-22 LAB — VAGINITIS/VAGINOSIS, DNA PROBE
Candida Species: NEGATIVE
Gardnerella vaginalis: POSITIVE — AB
Trichomonas vaginosis: NEGATIVE

## 2018-11-22 LAB — HEMOGLOBIN A1C
Est. average glucose Bld gHb Est-mCnc: 105 mg/dL
Hgb A1c MFr Bld: 5.3 % (ref 4.8–5.6)

## 2018-11-22 LAB — HEP, RPR, HIV PANEL
HIV Screen 4th Generation wRfx: NONREACTIVE
Hepatitis B Surface Ag: NEGATIVE
RPR Ser Ql: NONREACTIVE

## 2018-11-22 LAB — GC/CHLAMYDIA PROBE AMP
Chlamydia trachomatis, NAA: NEGATIVE
Neisseria Gonorrhoeae by PCR: NEGATIVE

## 2018-11-22 LAB — LIPID PANEL
Chol/HDL Ratio: 2.5 ratio (ref 0.0–4.4)
Cholesterol, Total: 187 mg/dL (ref 100–199)
HDL: 74 mg/dL (ref >39)
LDL Chol Calc (NIH): 98 mg/dL (ref 0–99)
Triglycerides: 82 mg/dL (ref 0–149)
VLDL Cholesterol Cal: 15 mg/dL (ref 5–40)

## 2018-11-22 LAB — TSH: TSH: 1.13 u[IU]/mL (ref 0.450–4.500)

## 2018-11-22 LAB — HEPATITIS C ANTIBODY: Hep C Virus Ab: <0.1 s/co ratio (ref 0.0–0.9)

## 2018-11-23 LAB — URINE CULTURE

## 2018-11-25 ENCOUNTER — Other Ambulatory Visit: Payer: Self-pay

## 2018-11-25 ENCOUNTER — Other Ambulatory Visit: Payer: Self-pay | Admitting: Obstetrics and Gynecology

## 2018-11-25 MED ORDER — METRONIDAZOLE 500 MG PO TABS: 500.0000 mg | ORAL_TABLET | Freq: Two times a day (BID) | ORAL | 0 refills | Status: DC

## 2018-11-25 MED ORDER — FLUCONAZOLE 150 MG PO TABS: ORAL_TABLET | ORAL | 0 refills | Status: DC

## 2018-11-26 DIAGNOSIS — M12271 Villonodular synovitis (pigmented), right ankle and foot: Secondary | ICD-10-CM | POA: Diagnosis not present

## 2018-11-27 ENCOUNTER — Other Ambulatory Visit: Payer: Self-pay | Admitting: Obstetrics and Gynecology

## 2018-11-27 DIAGNOSIS — Z1231 Encounter for screening mammogram for malignant neoplasm of breast: Secondary | ICD-10-CM

## 2018-12-03 ENCOUNTER — Other Ambulatory Visit: Payer: BC Managed Care – PPO

## 2018-12-03 ENCOUNTER — Other Ambulatory Visit: Payer: Self-pay | Admitting: *Deleted

## 2018-12-03 ENCOUNTER — Other Ambulatory Visit: Payer: Self-pay

## 2018-12-03 DIAGNOSIS — R944 Abnormal results of kidney function studies: Secondary | ICD-10-CM

## 2018-12-03 DIAGNOSIS — R7989 Other specified abnormal findings of blood chemistry: Secondary | ICD-10-CM

## 2018-12-04 LAB — RENAL FUNCTION PANEL
Albumin: 4 g/dL (ref 3.8–4.8)
BUN/Creatinine Ratio: 13 (ref 9–23)
BUN: 14 mg/dL (ref 6–24)
CO2: 21 mmol/L (ref 20–29)
Calcium: 8.7 mg/dL (ref 8.7–10.2)
Chloride: 105 mmol/L (ref 96–106)
Creatinine, Ser: 1.07 mg/dL — ABNORMAL HIGH (ref 0.57–1.00)
GFR calc Af Amer: 70 mL/min/{1.73_m2} (ref >59)
GFR calc non Af Amer: 61 mL/min/{1.73_m2} (ref >59)
Glucose: 85 mg/dL (ref 65–99)
Phosphorus: 2.7 mg/dL — ABNORMAL LOW (ref 3.0–4.3)
Potassium: 4.4 mmol/L (ref 3.5–5.2)
Sodium: 139 mmol/L (ref 134–144)

## 2018-12-05 ENCOUNTER — Telehealth: Payer: Self-pay | Admitting: Obstetrics and Gynecology

## 2018-12-05 ENCOUNTER — Encounter: Payer: Self-pay | Admitting: Obstetrics and Gynecology

## 2018-12-05 NOTE — Telephone Encounter (Signed)
Patient sent the following message through [facility]. Routing to triage to assist patient with request.  Barbette Merino [facility]  Phone Number: [phone]        Hello,   I pray all is well with you and your family . I requested to know my blood type. Reason is because I am adopted and just found my biological mother not to long ago and next is the biological father. I think it's very important information for myself and if I had to donate to my parents or sibling.   Greatly appreciated  Alya  Ps. Enjoy the holidays

## 2018-12-05 NOTE — Telephone Encounter (Signed)
Reviewed labs in Epic.  01/09/2012 ABO/RH : O positive   Call returned to patient, advised as seen above. Patient verbalizes understanding and is agreeable.   Routing to provider for final review. Patient is agreeable to disposition. Will close encounter.

## 2019-01-17 DIAGNOSIS — Z1231 Encounter for screening mammogram for malignant neoplasm of breast: Secondary | ICD-10-CM

## 2019-01-28 ENCOUNTER — Ambulatory Visit
Admission: RE | Admit: 2019-01-28 | Discharge: 2019-01-28 | Disposition: A | Discharge status: Home / Self Care | Payer: BC Managed Care – PPO | Source: Ambulatory Visit | Attending: Obstetrics and Gynecology | Admitting: Obstetrics and Gynecology

## 2019-01-28 ENCOUNTER — Other Ambulatory Visit: Payer: Self-pay

## 2019-01-28 DIAGNOSIS — Z1231 Encounter for screening mammogram for malignant neoplasm of breast: Secondary | ICD-10-CM

## 2019-01-28 NOTE — Telephone Encounter (Signed)
I only gave her a small amount of Topiramate to get her in to see her primary. She needs to fill this with her Primary.

## 2019-01-28 NOTE — Telephone Encounter (Signed)
Medication refill request: Topiramate Last AEX:  11/21/18 JJ Next AEX: 11/26/19 Last MMG (if hormonal medication request): 01/28/19 BIRADS 1 negative/density b Refill authorized: Please advise; order pended #30 w/1 refill if appropriate.

## 2019-01-29 NOTE — Telephone Encounter (Signed)
Spoke to pt. Pt had not requested this med, but pharmacy did because on auto refills. Pt states that PCP is aware of Topamax and will get future refills through them. Pt just had appt with PCP on 01/10/19. Pt thankful for RF and will let PCP know that she needs additional refills.   Pended #30 tabs, 0RF if approved.  Will route to Dr Talbert Nan and will close encounter.

## 2019-09-03 ENCOUNTER — Encounter: Payer: Self-pay | Admitting: Obstetrics and Gynecology

## 2019-09-03 ENCOUNTER — Ambulatory Visit (INDEPENDENT_AMBULATORY_CARE_PROVIDER_SITE_OTHER): Payer: 59 | Admitting: Obstetrics and Gynecology

## 2019-09-03 ENCOUNTER — Telehealth: Payer: Self-pay | Admitting: Obstetrics and Gynecology

## 2019-09-03 ENCOUNTER — Other Ambulatory Visit: Payer: Self-pay

## 2019-09-03 VITALS — BP 106/62 | HR 78 | Ht 67.0 in | Wt 228.0 lb

## 2019-09-03 DIAGNOSIS — Z113 Encounter for screening for infections with a predominantly sexual mode of transmission: Secondary | ICD-10-CM | POA: Diagnosis not present

## 2019-09-03 DIAGNOSIS — B373 Candidiasis of vulva and vagina: Secondary | ICD-10-CM

## 2019-09-03 DIAGNOSIS — B9689 Other specified bacterial agents as the cause of diseases classified elsewhere: Secondary | ICD-10-CM

## 2019-09-03 DIAGNOSIS — B3731 Acute candidiasis of vulva and vagina: Secondary | ICD-10-CM

## 2019-09-03 DIAGNOSIS — N760 Acute vaginitis: Secondary | ICD-10-CM | POA: Diagnosis not present

## 2019-09-03 MED ORDER — METRONIDAZOLE 500 MG PO TABS: 500.0000 mg | ORAL_TABLET | Freq: Two times a day (BID) | ORAL | 0 refills | Status: DC

## 2019-09-03 MED ORDER — FLUCONAZOLE 150 MG PO TABS: 150.0000 mg | ORAL_TABLET | Freq: Once | ORAL | 0 refills | Status: AC

## 2019-09-03 MED ORDER — BETAMETHASONE VALERATE 0.1 % EX OINT: 1.0000 "application " | TOPICAL_OINTMENT | Freq: Two times a day (BID) | CUTANEOUS | 0 refills | Status: DC

## 2019-09-03 NOTE — Telephone Encounter (Signed)
Appointment Request From: Zonia Kief    With Provider: Salvadore Dom, MD [facility]]    Preferred Date Range: Any    Preferred Times: Any Time    Reason for visit: Request an Appointment    Comments:  Vagina is irritated and slight discharge

## 2019-09-03 NOTE — Telephone Encounter (Signed)
AEX 10/2018, next scheduled 10/2019 H/O BV and yeast, last + BV 10/2018  Spoke with pt. Pt states having vaginal irritation, discharge, and slight odor x 2 days. Pt states changed soaps last week and now started having sx after weekend.  Pt denies any UTI sx, fever, chills, abd pain.  Pt advised to have OV for further evaluation. Pt agreeable. Pt scheduled today as work-in appt at 1115 am per Dr Talbert Nan. Pt agreeable and verbalized understanding.  Encounter closed.

## 2019-09-03 NOTE — Patient Instructions (Signed)
Vaginitis Vaginitis is a condition in which the vaginal tissue swells and becomes red (inflamed). This condition is most often caused by a change in the normal balance of bacteria and yeast that live in the vagina. This change causes an overgrowth of certain bacteria or yeast, which causes the inflammation. There are different types of vaginitis, but the most common types are:  Bacterial vaginosis.  Yeast infection (candidiasis).  Trichomoniasis vaginitis. This is a sexually transmitted disease (STD).  Viral vaginitis.  Atrophic vaginitis.  Allergic vaginitis. What are the causes? The cause of this condition depends on the type of vaginitis. It can be caused by:  Bacteria (bacterial vaginosis).  Yeast, which is a fungus (yeast infection).  A parasite (trichomoniasis vaginitis).  A virus (viral vaginitis).  Low hormone levels (atrophic vaginitis). Low hormone levels can occur during pregnancy, breastfeeding, or after menopause.  Irritants, such as bubble baths, scented tampons, and feminine sprays (allergic vaginitis). Other factors can change the normal balance of the yeast and bacteria that live in the vagina. These include:  Antibiotic medicines.  Poor hygiene.  Diaphragms, vaginal sponges, spermicides, birth control pills, and intrauterine devices (IUD).  Sex.  Infection.  Uncontrolled diabetes.  A weakened defense (immune) system. What increases the risk? This condition is more likely to develop in women who:  Smoke.  Use vaginal douches, scented tampons, or scented sanitary pads.  Wear tight-fitting pants.  Wear thong underwear.  Use oral birth control pills or an IUD.  Have sex without a condom.  Have multiple sex partners.  Have an STD.  Frequently use the spermicide nonoxynol-9.  Eat lots of foods high in sugar.  Have uncontrolled diabetes.  Have low estrogen levels.  Have a weakened immune system from an immune disorder or medical  treatment.  Are pregnant or breastfeeding. What are the signs or symptoms? Symptoms vary depending on the cause of the vaginitis. Common symptoms include:  Abnormal vaginal discharge. ? The discharge is white, gray, or yellow with bacterial vaginosis. ? The discharge is thick, white, and cheesy with a yeast infection. ? The discharge is frothy and yellow or greenish with trichomoniasis.  A bad vaginal smell. The smell is fishy with bacterial vaginosis.  Vaginal itching, pain, or swelling.  Sex that is painful.  Pain or burning when urinating. Sometimes there are no symptoms. How is this diagnosed? This condition is diagnosed based on your symptoms and medical history. A physical exam, including a pelvic exam, will also be done. You may also have other tests, including:  Tests to determine the pH level (acidity or alkalinity) of your vagina.  A whiff test, to assess the odor that results when a sample of your vaginal discharge is mixed with a potassium hydroxide solution.  Tests of vaginal fluid. A sample will be examined under a microscope. How is this treated? Treatment varies depending on the type of vaginitis you have. Your treatment may include:  Antibiotic creams or pills to treat bacterial vaginosis and trichomoniasis.  Antifungal medicines, such as vaginal creams or suppositories, to treat a yeast infection.  Medicine to ease discomfort if you have viral vaginitis. Your sexual partner should also be treated.  Estrogen delivered in a cream, pill, suppository, or vaginal ring to treat atrophic vaginitis. If vaginal dryness occurs, lubricants and moisturizing creams may help. You may need to avoid scented soaps, sprays, or douches.  Stopping use of a product that is causing allergic vaginitis. Then using a vaginal cream to treat the symptoms. Follow   these instructions at home: Lifestyle  Keep your genital area clean and dry. Avoid soap, and only rinse the area with  water.  Do not douche or use tampons until your health care provider says it is okay to do so. Use sanitary pads, if needed.  Do not have sex until your health care provider approves. When you can return to sex, practice safe sex and use condoms.  Wipe from front to back. This avoids the spread of bacteria from the rectum to the vagina. General instructions  Take over-the-counter and prescription medicines only as told by your health care provider.  If you were prescribed an antibiotic medicine, take or use it as told by your health care provider. Do not stop taking or using the antibiotic even if you start to feel better.  Keep all follow-up visits as told by your health care provider. This is important. How is this prevented?  Use mild, non-scented products. Do not use things that can irritate the vagina, such as fabric softeners. Avoid the following products if they are scented: ? Feminine sprays. ? Detergents. ? Tampons. ? Feminine hygiene products. ? Soaps or bubble baths.  Let air reach your genital area. ? Wear cotton underwear to reduce moisture buildup. ? Avoid wearing underwear while you sleep. ? Avoid wearing tight pants and underwear or nylons without a cotton panel. ? Avoid wearing thong underwear.  Take off any wet clothing, such as bathing suits, as soon as possible.  Practice safe sex and use condoms. Contact a health care provider if:  You have abdominal pain.  You have a fever.  You have symptoms that last for more than 2-3 days. Get help right away if:  You have a fever and your symptoms suddenly get worse. Summary  Vaginitis is a condition in which the vaginal tissue becomes inflamed.This condition is most often caused by a change in the normal balance of bacteria and yeast that live in the vagina.  Treatment varies depending on the type of vaginitis you have.  Do not douche, use tampons , or have sex until your health care provider approves. When  you can return to sex, practice safe sex and use condoms. This information is not intended to replace advice given to you by your health care provider. Make sure you discuss any questions you have with your health care provider. Document Revised: 12/29/2016 Document Reviewed: 02/22/2016 Elsevier Patient Education  2020 Elsevier Inc.  

## 2019-09-03 NOTE — Progress Notes (Signed)
GYNECOLOGY  VISIT   HPI: 49 y.o.   Single Black or African American Not Hispanic or Latino  Female. H/O hysterectomy/BS   G2P0020 with Patient's last menstrual period was 09/26/2011.   here for BV symptoms. Vaginal irritation and discharge. She used Carlton Adam last week. Symptoms started 3 days ago, thin watery vaginal d/c, feels itchy, slight odor. Using condoms. She would vaginal testing for STD's, declines blood work. Doesn't think her boyfriend is cheating, just wants to be safe.  GYNECOLOGIC HISTORY: Patient's last menstrual period was 09/26/2011. Contraception:NA Menopausal hormone therapy: none         OB History    Gravida  2   Para  0   Term  0   Preterm  0   AB  2   Living  0     SAB  0   TAB  0   Ectopic  0   Multiple  0   Live Births  0              Patient Active Problem List   Diagnosis Date Noted  . Migraine without aura and responsive to treatment 02/22/2015  . Acne 08/04/2013  . Fatigue 08/04/2013  . Adult BMI 30+ 08/04/2013  . Rectal bleeding 04/29/2012  . 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 AC (acromioclavicular) joint bone spurs    left foot  . Cataract, right eye   . Fibroid uterus   . Migraine headache without aura   . No pertinent past medical history   . STD (sexually transmitted disease)    HSV II    Past Surgical History:  Procedure Laterality Date  . BILATERAL SALPINGECTOMY  01/09/2012   Procedure: BILATERAL SALPINGECTOMY;  Surgeon: Azalia Bilis, MD;  Location: [facility];  Service: Gynecology;  Laterality: Bilateral;  . CYSTOSCOPY  01/09/2012   Procedure: CYSTOSCOPY;  Surgeon: Azalia Bilis, MD;  Location: [facility];  Service: Gynecology;  Laterality: N/A;  . EYE SURGERY    . FOOT SURGERY  2013   bone spurs removed  . FOOT SURGERY Right   . fractured wrist Right 2008  . removal of uterine fibroids     in pt's 20's   . ROBOTIC ASSISTED TOTAL HYSTERECTOMY  01/09/2012    Procedure: ROBOTIC ASSISTED TOTAL HYSTERECTOMY;  Surgeon: Azalia Bilis, MD;  Location: [facility];  Service: Gynecology;  Laterality: N/A;  . WISDOM TOOTH EXTRACTION  08/06/2015    Current Outpatient Medications  Medication Sig Dispense Refill  . cetirizine (ZYRTEC) 10 MG tablet Take 10 mg by mouth.    Marland Kitchen ibuprofen (ADVIL) 800 MG tablet Take by mouth.    . Multiple Vitamin (MULTIVITAMIN WITH MINERALS) TABS Take 1 tablet by mouth daily.    . rizatriptan (MAXALT-MLT) 10 MG disintegrating tablet DISSOLVE 1 TABLET(10 MG) ON THE TONGUE 1 TIME AS NEEDED    . topiramate (TOPAMAX) 25 MG tablet Take 1 tablet (25 mg total) by mouth daily. 30 tablet 1  . valACYclovir (VALTREX) 1000 MG tablet TAKE 1 TABLET(1000 MG) BY MOUTH DAILY 90 tablet 3   No current facility-administered medications for this visit.     ALLERGIES: Latex  Family History  Adopted: Yes  Problem Relation Age of Onset  . Asthma Mother   . Diabetes Mother        insulin dependent  . Breast cancer Neg Hx     Social History   Socioeconomic History  .  Marital status: Single    Spouse name: Not on file  . Number of children: Not on file  . Years of education: Not on file  . Highest education level: Not on file  Occupational History  . Not on file  Tobacco Use  . Smoking status: Never Smoker  . Smokeless tobacco: Never Used  Vaping Use  . Vaping Use: Never used  Substance and Sexual Activity  . Alcohol use: Yes    Alcohol/week: 1.0 - 2.0 standard drink    Types: 1 - 2 Glasses of wine per week  . Drug use: No  . Sexual activity: Not Currently    Partners: Male    Birth control/protection: Condom, Surgical    Comment: R-TLH 12/2011  Other Topics Concern  . Not on file  Social History Narrative  . Not on file   Social Determinants of Health   Financial Resource Strain:   . Difficulty of Paying Living Expenses:   Food Insecurity:   . Worried About Charity fundraiser in the Last Year:   . Arboriculturist in  the Last Year:   Transportation Needs:   . Film/video editor (Medical):   Marland Kitchen Lack of Transportation (Non-Medical):   Physical Activity:   . Days of Exercise per Week:   . Minutes of Exercise per Session:   Stress:   . Feeling of Stress :   Social Connections:   . Frequency of Communication with Friends and Family:   . Frequency of Social Gatherings with Friends and Family:   . Attends Religious Services:   . Active Member of Clubs or Organizations:   . Attends Archivist Meetings:   Marland Kitchen Marital Status:   Intimate Partner Violence:   . Fear of Current or Ex-Partner:   . Emotionally Abused:   Marland Kitchen Physically Abused:   . Sexually Abused:     Review of Systems  All other systems reviewed and are negative.   PHYSICAL EXAMINATION:    BP 106/62   Pulse 78   Ht 5\' 7"  (1.702 m)   Wt 228 lb (103.4 kg)   LMP 09/26/2011   SpO2 99%   BMI 35.71 kg/m     General appearance: alert, cooperative and appears stated age  Pelvic: External genitalia:  no lesions              Urethra:  normal appearing urethra with no masses, tenderness or lesions              Bartholins and Skenes: normal                 Vagina: normal appearing vagina with an increase in thick, white vaginal d/c              Cervix: absent               Chaperone was present for exam.  Wet prep: ++ clue, no trich, + wbc KOH: ++ yeast PH: 5   ASSESSMENT Yeast vaginitis Bacterial vaginitis STD screening, declines blood work    PLAN Flagyl and Diflucan Steroid ointment STD testing

## 2019-09-04 LAB — CHLAMYDIA/GONOCOCCUS/TRICHOMONAS, NAA
Chlamydia by NAA: NEGATIVE
Gonococcus by NAA: NEGATIVE
Trich vag by NAA: NEGATIVE

## 2019-11-24 NOTE — Progress Notes (Signed)
49 y.o. G2P0020 Single Black or African American Not Hispanic or Latino female here for annual exam. H/O hysterectomy/BS. She is back with her ex boyfriend, don't live together. Wants STD testing to be safe. No dyspareunia. No hot flashes or night sweats. No bowel or bladder issues.      Patient's last menstrual period was 09/26/2011.          Sexually active: Yes.    The current method of family planning is status post hysterectomy.    Exercising: No.  The patient does not participate in regular exercise at present. Smoker:  no  Health Maintenance: Pap: 12/18/2017 WNL, 11-04-14 WNl NEG HR HPV  History of abnormal Pap:  Yes, prior to her hysterectomy, had a leep. MMG:  01/28/19 density B Bi-rads 1 neg  BMD:   Never  Colonoscopy: 09/04/18 diverticulosis, f/u 5 years.  TDaP:  10/30/12 Gardasil: NA    reports that she has never smoked. She has never used smokeless tobacco. She reports current alcohol use of about 1.0 - 2.0 standard drink of alcohol per week. She reports that she does not use drugs. [facility]  Past Medical History:  Diagnosis Date  . AC (acromioclavicular) joint bone spurs    left foot  . Cataract, right eye   . Fibroid uterus   . Migraine headache without aura   . No pertinent past medical history   . STD (sexually transmitted disease)    HSV II    Past Surgical History:  Procedure Laterality Date  . BILATERAL SALPINGECTOMY  01/09/2012   Procedure: BILATERAL SALPINGECTOMY;  Surgeon: Azalia Bilis, MD;  Location: [facility];  Service: Gynecology;  Laterality: Bilateral;  . CYSTOSCOPY  01/09/2012   Procedure: CYSTOSCOPY;  Surgeon: Azalia Bilis, MD;  Location: [facility];  Service: Gynecology;  Laterality: N/A;  . EYE SURGERY    . FOOT SURGERY  2013   bone spurs removed  . FOOT SURGERY Right   . fractured wrist Right 2008  . removal of uterine fibroids     in pt's 20's   . ROBOTIC ASSISTED TOTAL HYSTERECTOMY  01/09/2012   Procedure:  ROBOTIC ASSISTED TOTAL HYSTERECTOMY;  Surgeon: Azalia Bilis, MD;  Location: [facility];  Service: Gynecology;  Laterality: N/A;  . WISDOM TOOTH EXTRACTION  08/06/2015    Current Outpatient Medications  Medication Sig Dispense Refill  . cetirizine (ZYRTEC) 10 MG tablet Take 10 mg by mouth.    . cyclobenzaprine (FLEXERIL) 5 MG tablet Take by mouth.    . meloxicam (MOBIC) 15 MG tablet Take 15 mg by mouth daily.    . Multiple Vitamin (MULTIVITAMIN WITH MINERALS) TABS Take 1 tablet by mouth daily.    . Phentermine HCl 8 MG TABS One po daily between 10 am and noon    . rizatriptan (MAXALT-MLT) 10 MG disintegrating tablet DISSOLVE 1 TABLET(10 MG) ON THE TONGUE 1 TIME AS NEEDED    . valACYclovir (VALTREX) 1000 MG tablet TAKE 1 TABLET(1000 MG) BY MOUTH DAILY 90 tablet 3   No current facility-administered medications for this visit.    Family History  Adopted: Yes  Problem Relation Age of Onset  . Asthma Mother   . Diabetes Mother        insulin dependent  . Breast cancer Neg Hx     Review of Systems  Constitutional: Negative.   HENT: Negative.   Eyes: Negative.   Respiratory: Negative.   Cardiovascular: Negative.   Gastrointestinal: Negative.   Endocrine:  Negative.   Genitourinary: Negative.   Musculoskeletal: Negative.   Skin: Negative.   Allergic/Immunologic: Negative.   Neurological: Negative.   Hematological: Negative.   Psychiatric/Behavioral: Negative.     Exam:   BP 116/72 (BP Location: Right Arm, Patient Position: Sitting, Cuff Size: Normal)   Pulse 76   Resp 16   Ht 5' 6.5" (1.689 m)   Wt 227 lb (103 kg)   LMP 09/26/2011   BMI 36.09 kg/m   Weight change: @WEIGHTCHANGE @ Height:   Height: 5' 6.5" (168.9 cm)  Ht Readings from Last 3 Encounters:  11/26/19 5' 6.5" (1.689 m)  09/03/19 5\' 7"  (1.702 m)  11/21/18 5\' 7"  (1.702 m)    General appearance: alert, cooperative and appears stated age Head: Normocephalic, without obvious abnormality, atraumatic Neck: no  adenopathy, supple, symmetrical, trachea midline and thyroid normal to inspection and palpation Lungs: clear to auscultation bilaterally Cardiovascular: regular rate and rhythm Breasts: normal appearance, no masses or tenderness Abdomen: soft, non-tender; non distended,  no masses,  no organomegaly Extremities: extremities normal, atraumatic, no cyanosis or edema Skin: Skin color, texture, turgor normal. No rashes or lesions Lymph nodes: Cervical, supraclavicular, and axillary nodes normal. No abnormal inguinal nodes palpated Neurologic: Grossly normal   Pelvic: External genitalia:  no lesions              Urethra:  normal appearing urethra with no masses, tenderness or lesions              Bartholins and Skenes: normal                 Vagina: normal appearing vagina with normal color and discharge, no lesions              Cervix: absent               Bimanual Exam:  Uterus:  uterus absent              Adnexa: no mass, fullness, tenderness               Rectovaginal: Confirms               Anus:  normal sphincter tone, no lesions  Gae Dry chaperoned for the exam.  A:  Well Woman with normal exam  BMI 36    P:   STD testing  No pap this year  Screening labs HgbA1C, TSH  Discussed breast self exam  Discussed calcium and vit D intake

## 2019-11-26 ENCOUNTER — Other Ambulatory Visit: Payer: Self-pay

## 2019-11-26 ENCOUNTER — Encounter: Payer: Self-pay | Admitting: Obstetrics and Gynecology

## 2019-11-26 ENCOUNTER — Ambulatory Visit (INDEPENDENT_AMBULATORY_CARE_PROVIDER_SITE_OTHER): Payer: 59 | Admitting: Obstetrics and Gynecology

## 2019-11-26 VITALS — BP 116/72 | HR 76 | Resp 16 | Ht 66.5 in | Wt 227.0 lb

## 2019-11-26 DIAGNOSIS — Z113 Encounter for screening for infections with a predominantly sexual mode of transmission: Secondary | ICD-10-CM

## 2019-11-26 DIAGNOSIS — Z0000 Encounter for general adult medical examination without abnormal findings: Secondary | ICD-10-CM | POA: Diagnosis not present

## 2019-11-26 DIAGNOSIS — Z6836 Body mass index (BMI) 36.0-36.9, adult: Secondary | ICD-10-CM | POA: Diagnosis not present

## 2019-11-26 DIAGNOSIS — Z01419 Encounter for gynecological examination (general) (routine) without abnormal findings: Secondary | ICD-10-CM

## 2019-11-26 MED ORDER — VALACYCLOVIR HCL 1 G PO TABS: ORAL_TABLET | ORAL | 3 refills | Status: AC

## 2019-11-26 NOTE — Patient Instructions (Signed)
Perimenopause  Perimenopause is the normal time of life before and after menstrual periods stop completely (menopause). Perimenopause can begin 2-8 years before menopause, and it usually lasts for 1 year after menopause. During perimenopause, the ovaries may or may not produce an egg. What are the causes? This condition is caused by a natural change in hormone levels that happens as you get older. What increases the risk? This condition is more likely to start at an earlier age if you have certain medical conditions or treatments, including:  A tumor of the pituitary gland in the brain.  A disease that affects the ovaries and hormone production.  Radiation treatment for cancer.  Certain cancer treatments, such as chemotherapy or hormone (anti-estrogen) therapy.  Heavy smoking and excessive alcohol use.  Family history of early menopause. What are the signs or symptoms? Perimenopausal changes affect each woman differently. Symptoms of this condition may include:  Hot flashes.  Night sweats.  Irregular menstrual periods.  Decreased sex drive.  Vaginal dryness.  Headaches.  Mood swings.  Depression.  Memory problems or trouble concentrating.  Irritability.  Tiredness.  Weight gain.  Anxiety.  Trouble getting pregnant. How is this diagnosed? This condition is diagnosed based on your medical history, a physical exam, your age, your menstrual history, and your symptoms. Hormone tests may also be done. How is this treated? In some cases, no treatment is needed. You and your health care provider should make a decision together about whether treatment is necessary. Treatment will be based on your individual condition and preferences. Various treatments are available, such as:  Menopausal hormone therapy (MHT).  Medicines to treat specific symptoms.  Acupuncture.  Vitamin or herbal supplements. Before starting treatment, make sure to let your health care provider  know if you have a personal or family history of:  Heart disease.  Breast cancer.  Blood clots.  Diabetes.  Osteoporosis. Follow these instructions at home: Lifestyle  Do not use any products that contain nicotine or tobacco, such as cigarettes and e-cigarettes. If you need help quitting, ask your health care provider.  Eat a balanced diet that includes fresh fruits and vegetables, whole grains, soybeans, eggs, lean meat, and low-fat dairy.  Get at least 30 minutes of physical activity on 5 or more days each week.  Avoid alcoholic and caffeinated beverages, as well as spicy foods. This may help prevent hot flashes.  Get 7-8 hours of sleep each night.  Dress in layers that can be removed to help you manage hot flashes.  Find ways to manage stress, such as deep breathing, meditation, or journaling. General instructions  Keep track of your menstrual periods, including: ? When they occur. ? How heavy they are and how long they last. ? How much time passes between periods.  Keep track of your symptoms, noting when they start, how often you have them, and how long they last.  Take over-the-counter and prescription medicines only as told by your health care provider.  Take vitamin supplements only as told by your health care provider. These may include calcium, vitamin E, and vitamin D.  Use vaginal lubricants or moisturizers to help with vaginal dryness and improve comfort during sex.  Talk with your health care provider before starting any herbal supplements.  Keep all follow-up visits as told by your health care provider. This is important. This includes any group therapy or counseling. Contact a health care provider if:  You have heavy vaginal bleeding or pass blood clots.  Your period   lasts more than 2 days longer than normal.  Your periods are recurring sooner than 21 days.  You bleed after having sex. Get help right away if:  You have chest pain, trouble  breathing, or trouble talking.  You have severe depression.  You have pain when you urinate.  You have severe headaches.  You have vision problems. Summary  Perimenopause is the time when a woman's body begins to move into menopause. This may happen naturally or as a result of other health problems or medical treatments.  Perimenopause can begin 2-8 years before menopause, and it usually lasts for 1 year after menopause.  Perimenopausal symptoms can be managed through medicines, lifestyle changes, and complementary therapies such as acupuncture. This information is not intended to replace advice given to you by your health care provider. Make sure you discuss any questions you have with your health care provider. Document Revised: 12/29/2016 Document Reviewed: 02/22/2016 Elsevier Patient Education  2020 [employer]   EXERCISE AND DIET:  We recommended that you start or continue a regular exercise program for good health. Regular exercise means any activity that makes your heart beat faster and makes you sweat.  We recommend exercising at least 30 minutes per day at least 3 days a week, preferably 4 or 5.  We also recommend a diet low in fat and sugar.  Inactivity, poor dietary choices and obesity can cause diabetes, heart attack, stroke, and kidney damage, among others.    ALCOHOL AND SMOKING:  Women should limit their alcohol intake to no more than 7 drinks/beers/glasses of wine (combined, not each!) per week. Moderation of alcohol intake to this level decreases your risk of breast cancer and liver damage. And of course, no recreational drugs are part of a healthy lifestyle.  And absolutely no smoking or even second hand smoke. Most people know smoking can cause heart and lung diseases, but did you know it also contributes to weakening of your bones? Aging of your skin?  Yellowing of your teeth and nails?  CALCIUM AND VITAMIN D:  Adequate intake of calcium and Vitamin D are recommended.   The recommendations for exact amounts of these supplements seem to change often, but generally speaking 1,000 mg of calcium (between diet and supplement) and 800 units of Vitamin D per day seems prudent. Certain women may benefit from higher intake of Vitamin D.  If you are among these women, your doctor will have told you during your visit.    PAP SMEARS:  Pap smears, to check for cervical cancer or precancers,  have traditionally been done yearly, although recent scientific advances have shown that most women can have pap smears less often.  However, every woman still should have a physical exam from her gynecologist every year. It will include a breast check, inspection of the vulva and vagina to check for abnormal growths or skin changes, a visual exam of the cervix, and then an exam to evaluate the size and shape of the uterus and ovaries.  And after 51 years of age, a rectal exam is indicated to check for rectal cancers. We will also provide age appropriate advice regarding health maintenance, like when you should have certain vaccines, screening for sexually transmitted diseases, bone density testing, colonoscopy, mammograms, etc.   MAMMOGRAMS:  All women over 40 years old should have a yearly mammogram. Many facilities now offer a "3D" mammogram, which may cost around $50 extra out of pocket. If possible,  we recommend you accept the option   to have the 3D mammogram performed.  It both reduces the number of women who will be called back for extra views which then turn out to be normal, and it is better than the routine mammogram at detecting truly abnormal areas.    COLON CANCER SCREENING: Now recommend starting at age 45. At this time colonoscopy is not covered for routine screening until 50. There are take home tests that can be done between 45-49.   COLONOSCOPY:  Colonoscopy to screen for colon cancer is recommended for all women at age 50.  We know, you hate the idea of the prep.  We agree, BUT,  having colon cancer and not knowing it is worse!!  Colon cancer so often starts as a polyp that can be seen and removed at colonscopy, which can quite literally save your life!  And if your first colonoscopy is normal and you have no family history of colon cancer, most women don't have to have it again for 10 years.  Once every ten years, you can do something that may end up saving your life, right?  We will be happy to help you get it scheduled when you are ready.  Be sure to check your insurance coverage so you understand how much it will cost.  It may be covered as a preventative service at no cost, but you should check your particular policy.      Breast Self-Awareness Breast self-awareness means being familiar with how your breasts look and feel. It involves checking your breasts regularly and reporting any changes to your health care provider. Practicing breast self-awareness is important. A change in your breasts can be a sign of a serious medical problem. Being familiar with how your breasts look and feel allows you to find any problems early, when treatment is more likely to be successful. All women should practice breast self-awareness, including women who have had breast implants. How to do a breast self-exam One way to learn what is normal for your breasts and whether your breasts are changing is to do a breast self-exam. To do a breast self-exam: Look for Changes  1. Remove all the clothing above your waist. 2. Stand in front of a mirror in a room with good lighting. 3. Put your hands on your hips. 4. Push your hands firmly downward. 5. Compare your breasts in the mirror. Look for differences between them (asymmetry), such as: ? Differences in shape. ? Differences in size. ? Puckers, dips, and bumps in one breast and not the other. 6. Look at each breast for changes in your skin, such as: ? Redness. ? Scaly areas. 7. Look for changes in your nipples, such  as: ? Discharge. ? Bleeding. ? Dimpling. ? Redness. ? A change in position. Feel for Changes Carefully feel your breasts for lumps and changes. It is best to do this while lying on your back on the floor and again while sitting or standing in the shower or tub with soapy water on your skin. Feel each breast in the following way:  Place the arm on the side of the breast you are examining above your head.  Feel your breast with the other hand.  Start in the nipple area and make  inch (2 cm) overlapping circles to feel your breast. Use the pads of your three middle fingers to do this. Apply light pressure, then medium pressure, then firm pressure. The light pressure will allow you to feel the tissue closest to the skin. The   medium pressure will allow you to feel the tissue that is a little deeper. The firm pressure will allow you to feel the tissue close to the ribs.  Continue the overlapping circles, moving downward over the breast until you feel your ribs below your breast.  Move one finger-width toward the center of the body. Continue to use the  inch (2 cm) overlapping circles to feel your breast as you move slowly up toward your collarbone.  Continue the up and down exam using all three pressures until you reach your armpit.  Write Down What You Find  Write down what is normal for each breast and any changes that you find. Keep a written record with breast changes or normal findings for each breast. By writing this information down, you do not need to depend only on memory for size, tenderness, or location. Write down where you are in your menstrual cycle, if you are still menstruating. If you are having trouble noticing differences in your breasts, do not get discouraged. With time you will become more familiar with the variations in your breasts and more comfortable with the exam. How often should I examine my breasts? Examine your breasts every month. If you are breastfeeding, the  best time to examine your breasts is after a feeding or after using a breast pump. If you menstruate, the best time to examine your breasts is 5-7 days after your period is over. During your period, your breasts are lumpier, and it may be more difficult to notice changes. When should I see my health care provider? See your health care provider if you notice:  A change in shape or size of your breasts or nipples.  A change in the skin of your breast or nipples, such as a reddened or scaly area.  Unusual discharge from your nipples.  A lump or thick area that was not there before.  Pain in your breasts.  Anything that concerns you.  

## 2019-11-27 LAB — HEP, RPR, HIV PANEL
HIV Screen 4th Generation wRfx: NONREACTIVE
Hepatitis B Surface Ag: NEGATIVE
RPR Ser Ql: NONREACTIVE

## 2019-11-27 LAB — COMPREHENSIVE METABOLIC PANEL
ALT: 14 IU/L (ref 0–32)
AST: 12 IU/L (ref 0–40)
Albumin/Globulin Ratio: 1.7 (ref 1.2–2.2)
Albumin: 4 g/dL (ref 3.8–4.9)
Alkaline Phosphatase: 54 IU/L (ref 44–121)
BUN/Creatinine Ratio: 18 (ref 9–23)
BUN: 15 mg/dL (ref 6–24)
Bilirubin Total: <0.2 mg/dL (ref 0.0–1.2)
CO2: 25 mmol/L (ref 20–29)
Calcium: 9 mg/dL (ref 8.7–10.2)
Chloride: 101 mmol/L (ref 96–106)
Creatinine, Ser: 0.83 mg/dL (ref 0.57–1.00)
GFR calc Af Amer: 94 mL/min/{1.73_m2} (ref >59)
GFR calc non Af Amer: 82 mL/min/{1.73_m2} (ref >59)
Globulin, Total: 2.3 g/dL (ref 1.5–4.5)
Glucose: 87 mg/dL (ref 65–99)
Potassium: 4 mmol/L (ref 3.5–5.2)
Sodium: 137 mmol/L (ref 134–144)
Total Protein: 6.3 g/dL (ref 6.0–8.5)

## 2019-11-27 LAB — HEPATITIS C ANTIBODY: Hep C Virus Ab: <0.1 s/co ratio (ref 0.0–0.9)

## 2019-11-27 LAB — HEMOGLOBIN A1C
Est. average glucose Bld gHb Est-mCnc: 114 mg/dL
Hgb A1c MFr Bld: 5.6 % (ref 4.8–5.6)

## 2019-11-27 LAB — LIPID PANEL
Chol/HDL Ratio: 2.3 ratio (ref 0.0–4.4)
Cholesterol, Total: 175 mg/dL (ref 100–199)
HDL: 77 mg/dL (ref >39)
LDL Chol Calc (NIH): 84 mg/dL (ref 0–99)
Triglycerides: 74 mg/dL (ref 0–149)
VLDL Cholesterol Cal: 14 mg/dL (ref 5–40)

## 2019-11-27 LAB — TSH: TSH: 1.13 u[IU]/mL (ref 0.450–4.500)

## 2019-11-27 LAB — CBC
Hematocrit: 36.7 % (ref 34.0–46.6)
Hemoglobin: 12.4 g/dL (ref 11.1–15.9)
MCH: 32 pg (ref 26.6–33.0)
MCHC: 33.8 g/dL (ref 31.5–35.7)
MCV: 95 fL (ref 79–97)
Platelets: 240 10*3/uL (ref 150–450)
RBC: 3.88 x10E6/uL (ref 3.77–5.28)
RDW: 11.7 % (ref 11.7–15.4)
WBC: 5.6 10*3/uL (ref 3.4–10.8)

## 2019-11-28 LAB — CHLAMYDIA/GONOCOCCUS/TRICHOMONAS, NAA
Chlamydia by NAA: NEGATIVE
Gonococcus by NAA: NEGATIVE
Trich vag by NAA: NEGATIVE

## 2019-12-31 ENCOUNTER — Other Ambulatory Visit: Payer: Self-pay | Admitting: Internal Medicine

## 2019-12-31 DIAGNOSIS — Z1231 Encounter for screening mammogram for malignant neoplasm of breast: Secondary | ICD-10-CM

## 2020-02-11 ENCOUNTER — Ambulatory Visit: Payer: 59

## 2020-03-24 ENCOUNTER — Ambulatory Visit: Payer: 59

## 2020-12-08 ENCOUNTER — Ambulatory Visit: Payer: 59 | Admitting: Obstetrics and Gynecology
